# Patient Record
Sex: Female | Born: 1985 | Race: Black or African American | Hispanic: No | Marital: Single | Smoking: Never smoker
Health system: Southern US, Community
[De-identification: ages and names within clinical notes are randomized; demographics above are authoritative.]

## PROBLEM LIST (undated history)

## (undated) ENCOUNTER — Emergency Department (HOSPITAL_BASED_OUTPATIENT_CLINIC_OR_DEPARTMENT_OTHER): Admission: EM | Payer: Self-pay | Source: Home / Self Care

## (undated) ENCOUNTER — Inpatient Hospital Stay (HOSPITAL_COMMUNITY): Payer: Self-pay

## (undated) DIAGNOSIS — O26619 Liver and biliary tract disorders in pregnancy, unspecified trimester: Secondary | ICD-10-CM

## (undated) DIAGNOSIS — N854 Malposition of uterus: Secondary | ICD-10-CM

## (undated) DIAGNOSIS — K831 Obstruction of bile duct: Secondary | ICD-10-CM

## (undated) DIAGNOSIS — Z8619 Personal history of other infectious and parasitic diseases: Secondary | ICD-10-CM

## (undated) DIAGNOSIS — D649 Anemia, unspecified: Secondary | ICD-10-CM

## (undated) HISTORY — PX: DILATION AND CURETTAGE OF UTERUS: SHX78

## (undated) HISTORY — DX: Malposition of uterus: N85.4

## (undated) HISTORY — DX: Personal history of other infectious and parasitic diseases: Z86.19

---

## 2011-10-12 ENCOUNTER — Other Ambulatory Visit (HOSPITAL_COMMUNITY): Payer: Self-pay | Admitting: Physician Assistant

## 2011-10-12 DIAGNOSIS — Z3689 Encounter for other specified antenatal screening: Secondary | ICD-10-CM

## 2011-10-12 LAB — OB RESULTS CONSOLE RPR: RPR: NONREACTIVE

## 2011-10-12 LAB — OB RESULTS CONSOLE ANTIBODY SCREEN: Antibody Screen: NEGATIVE

## 2011-10-12 LAB — OB RESULTS CONSOLE HIV ANTIBODY (ROUTINE TESTING): HIV: NONREACTIVE

## 2011-10-12 LAB — OB RESULTS CONSOLE RUBELLA ANTIBODY, IGM: Rubella: IMMUNE

## 2011-10-12 LAB — OB RESULTS CONSOLE GC/CHLAMYDIA: Gonorrhea: NEGATIVE

## 2011-10-14 ENCOUNTER — Ambulatory Visit (HOSPITAL_COMMUNITY)
Admission: RE | Admit: 2011-10-14 | Discharge: 2011-10-14 | Disposition: A | Discharge status: Home / Self Care | Payer: Medicaid Other | Source: Ambulatory Visit | Attending: Physician Assistant | Admitting: Physician Assistant

## 2011-10-14 DIAGNOSIS — O358XX0 Maternal care for other (suspected) fetal abnormality and damage, not applicable or unspecified: Secondary | ICD-10-CM | POA: Insufficient documentation

## 2011-10-14 DIAGNOSIS — Z363 Encounter for antenatal screening for malformations: Secondary | ICD-10-CM | POA: Insufficient documentation

## 2011-10-14 DIAGNOSIS — Z1389 Encounter for screening for other disorder: Secondary | ICD-10-CM | POA: Insufficient documentation

## 2011-10-14 DIAGNOSIS — Z3689 Encounter for other specified antenatal screening: Secondary | ICD-10-CM

## 2011-12-12 ENCOUNTER — Inpatient Hospital Stay (HOSPITAL_COMMUNITY): Payer: Medicaid Other

## 2011-12-12 ENCOUNTER — Encounter (HOSPITAL_BASED_OUTPATIENT_CLINIC_OR_DEPARTMENT_OTHER): Payer: Self-pay

## 2011-12-12 ENCOUNTER — Inpatient Hospital Stay (HOSPITAL_BASED_OUTPATIENT_CLINIC_OR_DEPARTMENT_OTHER)
Admission: EM | Admit: 2011-12-12 | Discharge: 2011-12-12 | Disposition: A | Discharge status: Other Acute Inpatient Hospital | Payer: Medicaid Other | Attending: Emergency Medicine | Admitting: Emergency Medicine

## 2011-12-12 DIAGNOSIS — O209 Hemorrhage in early pregnancy, unspecified: Secondary | ICD-10-CM | POA: Insufficient documentation

## 2011-12-12 DIAGNOSIS — Z862 Personal history of diseases of the blood and blood-forming organs and certain disorders involving the immune mechanism: Secondary | ICD-10-CM | POA: Insufficient documentation

## 2011-12-12 DIAGNOSIS — O4690 Antepartum hemorrhage, unspecified, unspecified trimester: Secondary | ICD-10-CM

## 2011-12-12 HISTORY — DX: Anemia, unspecified: D64.9

## 2011-12-12 LAB — URINALYSIS, ROUTINE W REFLEX MICROSCOPIC
Glucose, UA: NEGATIVE mg/dL
Ketones, ur: NEGATIVE mg/dL
Leukocytes, UA: NEGATIVE
Protein, ur: NEGATIVE mg/dL
Urobilinogen, UA: 0.2 mg/dL (ref 0.0–1.0)

## 2011-12-12 LAB — CBC WITH DIFFERENTIAL/PLATELET
Basophils Absolute: 0 10*3/uL (ref 0.0–0.1)
Basophils Relative: 0 % (ref 0–1)
Eosinophils Relative: 1 % (ref 0–5)
HCT: 30.5 % — ABNORMAL LOW (ref 36.0–46.0)
MCHC: 33.1 g/dL (ref 30.0–36.0)
Monocytes Absolute: 0.8 10*3/uL (ref 0.1–1.0)
Neutro Abs: 7.2 10*3/uL (ref 1.7–7.7)
Platelets: 196 10*3/uL (ref 150–400)
RDW: 13.3 % (ref 11.5–15.5)

## 2011-12-12 LAB — WET PREP, GENITAL
Trich, Wet Prep: NONE SEEN
Yeast Wet Prep HPF POC: NONE SEEN

## 2011-12-12 LAB — BASIC METABOLIC PANEL
Calcium: 9 mg/dL (ref 8.4–10.5)
Chloride: 102 mEq/L (ref 96–112)
Creatinine, Ser: 0.6 mg/dL (ref 0.50–1.10)
GFR calc Af Amer: >90 mL/min (ref >90)

## 2011-12-12 LAB — RAPID URINE DRUG SCREEN, HOSP PERFORMED
Amphetamines: NOT DETECTED
Opiates: NOT DETECTED
Tetrahydrocannabinol: POSITIVE — AB

## 2011-12-12 LAB — URINE MICROSCOPIC-ADD ON

## 2011-12-12 LAB — PROTIME-INR: Prothrombin Time: 12.6 seconds (ref 11.6–15.2)

## 2011-12-12 LAB — ABO/RH: ABO/RH(D): B POS

## 2011-12-12 MED ORDER — SODIUM CHLORIDE 0.9 % IV BOLUS (SEPSIS)
1000.0000 mL | Freq: Once | INTRAVENOUS | Status: AC
  Administered 2011-12-12: 1000 mL via INTRAVENOUS

## 2011-12-12 MED ORDER — METRONIDAZOLE 500 MG PO TABS: 500.0000 mg | ORAL_TABLET | Freq: Two times a day (BID) | ORAL | Status: DC

## 2011-12-12 NOTE — Progress Notes (Signed)
Spoke with RN @ [facility] stating that Ms. Alexis Robbins came in after having vaginal bleeding @ home following intercourse.  Pt states that bleeding was bright red when she wiped after going to the bathroom.  Pt reports feeling good fetal movement and no leaking of fluid.

## 2011-12-12 NOTE — MAU Provider Note (Addendum)
History     CSN: [id number]  Arrival date and time: 12/12/11 0010   First Provider Initiated Contact with Patient 12/12/11 0141   Alexis Robbins 26 y.o. [id number] : 29 5/7 by [place] today  Chief Complaint  Patient presents with  . pregnant with vaginal bleeding    HPI Patient presented to the MAU this evening for post-coital vaginal bleeding. She describes as bright red/ blood in the toilet. She wore a panty liner into the MAU, however it has no blood on it. She stated it is just pink tinged now. She has had prenatal care and has received a 20 week ultrasound that was WNL. Her first child was delivered by C-Section for breech position, but does state she "starting to become preE and that is why they induced." She had no complications with her second pregnancy or delivery, which was a successful VBAC.   Past Medical History  Diagnosis Date  . Pregnant   . Anemia     History reviewed. No pertinent past surgical history.  No family history on file.  History  Substance Use Topics  . Smoking status: Never Smoker   . Smokeless tobacco: Not on file  . Alcohol Use:     Allergies: No Known Allergies  No prescriptions prior to admission    Review of Systems  Constitutional: Negative for fever and chills.  Eyes: Negative for blurred vision and double vision.  Respiratory: Negative for shortness of breath.   Cardiovascular: Negative for chest pain.  Gastrointestinal: Negative for nausea, vomiting and diarrhea.  Musculoskeletal: Negative for back pain.  Neurological: Negative for dizziness, weakness and headaches.  All other systems reviewed and are negative.    Physical Exam   Blood pressure 110/72, pulse 100, temperature 98 F (36.7 C), resp. rate 20, last menstrual period 05/26/2011, SpO2 100.00%.  Physical Exam  Constitutional: She is oriented to person, place, and time. She appears well-developed and well-nourished. No distress.  HENT:  Head: Normocephalic and  atraumatic.  Eyes: Conjunctivae normal and EOM are normal. Pupils are equal, round, and reactive to light. Right eye exhibits no discharge. Left eye exhibits no discharge. No scleral icterus.  Neck: Neck supple.  Cardiovascular: Normal rate, regular rhythm, normal heart sounds and intact distal pulses.   No murmur heard. Respiratory: Effort normal and breath sounds normal. She has no wheezes. She has no rales.  GI: Soft. Bowel sounds are normal. She exhibits no distension and no mass. There is no tenderness. There is no rebound and no guarding.       Gravid   Genitourinary: Vagina normal.       Spec: Small amount of  dark brown blood notable. OS was visualized and closed with small amount of red mucous-like discharge draining form OS. No polyps or lacerations noted.   Musculoskeletal: Normal range of motion. She exhibits no edema and no tenderness.  Neurological: She is alert and oriented to person, place, and time.  Skin: Skin is warm and dry.  Psychiatric: She has a normal mood and affect.    EFM: reassuring with accelerations. Moderate variability. No contractions. MAU Course  Procedures 1. EFM 2. UA/UDS 3. Speculum exam 4. Ultrasound limited for placenta &  cervical length  5. Wet prep: BV  Assessment and Plan  1. Post-coital bleeding in pregnancy 2. Ultrasound: WNL. No signs of previa or placental abnormality; no cervical shortening  - EDD: 03/01/2012     GA: 29  5/7 3. BV: Flagyl 4. Discharge home with labor  precautions - Discussed patient and plan with Roney Marion, CNM  Kuneff, Renee 12/12/2011, 1:42 AM   I examined pt and agree with documentation above and resident plan of care. [facility]

## 2011-12-12 NOTE — ED Notes (Signed)
Patient reports that she developed some light vaginal bleeding this pm. Reports that she has no cramping or backpain with same. LMP 4/25, estimated [redacted] weeks gestation

## 2011-12-12 NOTE — ED Notes (Signed)
The bleeding started following sexual intercourse this evening

## 2011-12-12 NOTE — Progress Notes (Signed)
Spoke w/ Dr. Terressa Koyanagi about tracing.  She stated that she had spoken to Dr. Erin Fulling and POC was for pt to be transferred to [facility].

## 2011-12-12 NOTE — MAU Note (Signed)
Arrived via EMS from [facility].  No pain or  bleeding upon arrival.   States the bleeding began after intercourse.

## 2011-12-12 NOTE — ED Notes (Signed)
Patient remains on fetal monitor continuously.  Dr. Nicanor Alcon back at bedside for bi-manual exam. Small amount of pinkish tinged colored discharge on pantie liner. Patient reports that the baby remains active

## 2011-12-12 NOTE — ED Notes (Signed)
Spoke with RN Morrie Sheldon from [facility] [employer]- states they have monitored pt- no contractions noted- baby WNL- EDP [place] updated and speaking to [employer] at this time by phone

## 2011-12-12 NOTE — MAU Provider Note (Signed)
I reviewed pt and agree with documentation above and resident plan of care. [facility]

## 2011-12-12 NOTE — ED Provider Notes (Signed)
History    This chart was scribed for Alexis Parco Smitty Cords, MD, MD by Smitty Pluck, ED Scribe. The patient was seen in room MHT14 and the patient's care was started at 12:14PM.  CSN: [id number]  Arrival date & time 12/12/11  0010   None     No chief complaint on file.    Patient is a 26 y.o. female presenting with vaginal bleeding. The history is provided by the patient. No language interpreter was used.  Vaginal Bleeding This is a new problem. The current episode started less than 1 hour ago. The problem occurs constantly. The problem has been gradually improving. Pertinent negatives include no abdominal pain. The symptoms are aggravated by intercourse. Nothing relieves the symptoms. She has tried nothing for the symptoms. The treatment provided moderate relief.   Alexis Robbins is a 26 y.o. female who presents to the Emergency Department complaining of constant, moderate vaginal bleeding onset today 30 minutes ago. She states that bleeding started after sexual intercourse. She noticed blood in bed and in toilet stool. Denies clotting. Reports blood was dark read. Pt denies leakage of any other fluid. She is [redacted] weeks pregnant. Her due date is 03-01-12. Denies dysuria. Pt is gravida 3, para 2 and abortus 0. Pt denies any pain. Pt has taken iron supplements due to anemia. Denies any trauma, bleeding throughout pregnancy, fevers, chills, nausea, vomiting and any other pain. Pt was told that her baby was breech during last ultrasound (14 weeks). LMP was 05/26/11.   [facility]   No past medical history on file.  No past surgical history on file.  No family history on file.  History  Substance Use Topics  . Smoking status: Not on file  . Smokeless tobacco: Not on file  . Alcohol Use: Not on file    OB History    No data available      Review of Systems  Constitutional: Negative for fever.  Respiratory: Negative for cough.   Gastrointestinal:  Negative for vomiting and abdominal pain.  Genitourinary: Positive for vaginal bleeding.  All other systems reviewed and are negative.    Allergies  Review of patient's allergies indicates not on file.  Home Medications  No current outpatient prescriptions on file.  BP 128/89  Pulse 103  Temp 98 F (36.7 C)  Resp 20  SpO2 100%  LMP 05/26/2011  Physical Exam  Nursing note and vitals reviewed. Constitutional: She is oriented to person, place, and time. She appears well-developed and well-nourished.  HENT:  Head: Normocephalic and atraumatic.  Eyes:       Pale conjunctiva   Neck: Normal range of motion. Neck supple.  Cardiovascular: Normal rate, regular rhythm and normal heart sounds.   Pulmonary/Chest: Effort normal and breath sounds normal. No respiratory distress. She has no wheezes. She has no rales.  Genitourinary:       Uterus above umbilicus  No signs of lacerations Scant blood introitus  nitrazine paper positive   Neurological: She is alert and oriented to person, place, and time.  Skin: Skin is warm and dry.  Psychiatric: She has a normal mood and affect. Her behavior is normal.    ED Course  Procedures (including critical care time) DIAGNOSTIC STUDIES: Oxygen Saturation is 100% on room air, normal by my interpretation.    COORDINATION OF CARE: 12:29 AM Discussed ED treatment with pt     Labs Reviewed - No data to display No results found.  No diagnosis found.    MDM  Case d/w Dr. Erin Fulling transfer to MAU   Patient placed on wedge.  Bolus of IVF initiated Sterile gloves used. Chaperone present. Internal exam deferred. No presenting parts.    I personally performed the services described in this documentation, which was scribed in my presence. The recorded information has been reviewed and is accurate.    Jasmine Awe, MD 12/12/11 [phone]

## 2011-12-15 NOTE — MAU Provider Note (Signed)
Attestation of Attending Supervision of Advanced Practitioner (CNM/NP): Evaluation and management procedures were performed by the Advanced Practitioner under my supervision and collaboration.  I have reviewed the Advanced Practitioner's note and chart, and I agree with the management and plan.  Clarinda Obi 12/15/2011 2:26 PM

## 2012-02-01 NOTE — L&D Delivery Note (Signed)
Delivery Note At 9:26 PM a viable female was delivered via successful spontaneous vaginal birth after cesarean (Presentation: ROA ), somersaulted through loose nuchal cord x1.  APGAR:9/9; weight: pending   Placenta status:delivered spontaneously intact w/ 3vc Cord: 3vc with the following complications:none  Cord pH: not done  Anesthesia: Epidural  Episiotomy: n/a Lacerations: intact Suture Repair: n/a Est. Blood Loss (mL): 250  Mom to postpartum.  Baby to nursery-stable.  Breastfeeding, for interim BTL d/t papers not signed.  Desires outpatient circumcision.   Marge Duncans 02/11/2012, 9:54 PM

## 2012-02-08 LAB — OB RESULTS CONSOLE GBS: GBS: NEGATIVE

## 2012-02-10 ENCOUNTER — Encounter: Payer: Self-pay | Admitting: Obstetrics & Gynecology

## 2012-02-10 ENCOUNTER — Inpatient Hospital Stay (HOSPITAL_COMMUNITY)
Admission: AD | Admit: 2012-02-10 | Discharge: 2012-02-13 | DRG: 775 | Disposition: A | Discharge status: Home / Self Care | Payer: Medicaid Other | Source: Ambulatory Visit | Attending: Obstetrics & Gynecology | Admitting: Obstetrics & Gynecology

## 2012-02-10 DIAGNOSIS — K831 Obstruction of bile duct: Secondary | ICD-10-CM

## 2012-02-10 DIAGNOSIS — O26619 Liver and biliary tract disorders in pregnancy, unspecified trimester: Principal | ICD-10-CM | POA: Diagnosis present

## 2012-02-10 DIAGNOSIS — O26649 Intrahepatic cholestasis of pregnancy, unspecified trimester: Secondary | ICD-10-CM | POA: Insufficient documentation

## 2012-02-10 DIAGNOSIS — K838 Other specified diseases of biliary tract: Secondary | ICD-10-CM | POA: Diagnosis present

## 2012-02-10 DIAGNOSIS — O34219 Maternal care for unspecified type scar from previous cesarean delivery: Secondary | ICD-10-CM | POA: Diagnosis present

## 2012-02-10 HISTORY — DX: Obstruction of bile duct: K83.1

## 2012-02-10 HISTORY — DX: Liver and biliary tract disorders in pregnancy, unspecified trimester: O26.619

## 2012-02-10 LAB — TYPE AND SCREEN: ABO/RH(D): B POS

## 2012-02-10 LAB — CBC
Hemoglobin: 10.4 g/dL — ABNORMAL LOW (ref 12.0–15.0)
MCHC: 32.7 g/dL (ref 30.0–36.0)
RDW: 15.4 % (ref 11.5–15.5)
WBC: 12.7 10*3/uL — ABNORMAL HIGH (ref 4.0–10.5)

## 2012-02-10 MED ORDER — OXYCODONE-ACETAMINOPHEN 5-325 MG PO TABS: 1.0000 | ORAL_TABLET | ORAL | Status: DC | PRN

## 2012-02-10 MED ORDER — ONDANSETRON HCL 4 MG/2ML IJ SOLN: 4.0000 mg | Freq: Four times a day (QID) | INTRAMUSCULAR | Status: DC | PRN

## 2012-02-10 MED ORDER — FENTANYL CITRATE 0.05 MG/ML IJ SOLN
100.0000 ug | INTRAMUSCULAR | Status: DC | PRN
  Administered 2012-02-11 (×3): 100 ug via INTRAVENOUS
  Filled 2012-02-10 (×4): qty 2

## 2012-02-10 MED ORDER — CITRIC ACID-SODIUM CITRATE 334-500 MG/5ML PO SOLN: 30.0000 mL | ORAL | Status: DC | PRN

## 2012-02-10 MED ORDER — LACTATED RINGERS IV SOLN
500.0000 mL | INTRAVENOUS | Status: DC | PRN
  Administered 2012-02-11: 500 mL via INTRAVENOUS

## 2012-02-10 MED ORDER — OXYTOCIN BOLUS FROM INFUSION: 500.0000 mL | INTRAVENOUS | Status: DC

## 2012-02-10 MED ORDER — ACETAMINOPHEN 325 MG PO TABS: 650.0000 mg | ORAL_TABLET | ORAL | Status: DC | PRN

## 2012-02-10 MED ORDER — OXYTOCIN 40 UNITS IN LACTATED RINGERS INFUSION - SIMPLE MED
62.5000 mL/h | INTRAVENOUS | Status: DC
  Administered 2012-02-11: 62.5 mL/h via INTRAVENOUS

## 2012-02-10 MED ORDER — ZOLPIDEM TARTRATE 5 MG PO TABS
5.0000 mg | ORAL_TABLET | Freq: Every evening | ORAL | Status: DC | PRN
  Administered 2012-02-10: 5 mg via ORAL
  Filled 2012-02-10: qty 1

## 2012-02-10 MED ORDER — IBUPROFEN 600 MG PO TABS: 600.0000 mg | ORAL_TABLET | Freq: Four times a day (QID) | ORAL | Status: DC | PRN

## 2012-02-10 MED ORDER — LACTATED RINGERS IV SOLN
INTRAVENOUS | Status: DC
  Administered 2012-02-10 – 2012-02-11 (×2): via INTRAVENOUS

## 2012-02-10 MED ORDER — LIDOCAINE HCL (PF) 1 % IJ SOLN: 30.0000 mL | INTRAMUSCULAR | Status: DC | PRN

## 2012-02-10 NOTE — H&P (Signed)
Alexis Robbins is a 27 y.o. G3P2002 at [redacted]w[redacted]d presenting for IOL secondary to cholestasis of pregnancy.  She first started itching a few weeks ago, but it grew unbearable last weekend, and she reported it at her scheduled prenatal visit, so labs were drawn, which returned today and she was advised that IOL would need to begin. At the time of my exam, her itching is minimal, she is feeling fetal movement, has not had any LOF or blood.  First pregnancy was LTCS due to breech presentation with subsequent VBAC in 2011, and desires TOLAC with this pregnancy; consent is signed.  Maternal Medical History:  Reason for admission: Reason for Admission:   nauseaIOL for cholestasis of pregnancy  Contractions: Frequency: irregular.    Fetal activity: Perceived fetal activity is normal.   Last perceived fetal movement was within the past hour.    Prenatal complications: No bleeding, hypertension or pre-eclampsia.   Prenatal Complications - Diabetes: none.   1 hr GTT 81  OB History    Grav Para Term Preterm Abortions TAB SAB Ect Mult Living   3 2 2       2      Past Medical History  Diagnosis Date  . Pregnant   . Anemia   . Intrahepatic cholestasis of pregnancy, antepartum 02/10/2012    Induction of labor   Past Surgical History  Procedure Date  . Cesarean section    Family History: family history is not on file. Social History:  reports that she has never smoked. She does not have any smokeless tobacco history on file. Her alcohol and drug histories not on file.   Prenatal Transfer Tool  Maternal Diabetes: No Genetic Screening: Normal Maternal Ultrasounds/Referrals: Normal Fetal Ultrasounds or other Referrals:  None Maternal Substance Abuse:  No Significant Maternal Medications:  None Significant Maternal Lab Results:  Lab values include: Group B Strep negative Other Comments:  None  Review of Systems  Constitutional: Negative for fever and chills.  Eyes: Negative for blurred  vision.  Respiratory: Negative for cough and shortness of breath.   Cardiovascular: Negative for chest pain and palpitations.  Gastrointestinal: Negative for heartburn, nausea, vomiting and constipation.  Genitourinary: Negative for dysuria.  Musculoskeletal: Negative for myalgias.  Skin: Positive for itching. Negative for rash.  Neurological: Negative for dizziness and headaches.    Dilation: 1 Effacement (%): 60 Station: -2 Exam by:: K. Clelia Croft, cnm Blood pressure 116/75, pulse 102, temperature 98.2 F (36.8 C), temperature source Oral, resp. rate 18, height 5\' 6"  (1.676 m), weight 87.998 kg (194 lb). Maternal Exam:  Uterine Assessment: Contraction frequency is irregular.   Abdomen: Patient reports no abdominal tenderness. Surgical scars: low transverse.   Fetal presentation: vertex  Introitus: Normal vulva. Normal vagina.  Cervix: Cervix evaluated by digital exam.   1/  Fetal Exam Fetal Monitor Review: Baseline rate: 120.  Variability: moderate (6-25 bpm).   Pattern: accelerations present and no decelerations.    Fetal State Assessment: Category I - tracings are normal.     Physical Exam  Constitutional: She is oriented to person, place, and time. She appears well-developed and well-nourished. No distress.  HENT:  Head: Normocephalic and atraumatic.  Mouth/Throat: Oropharynx is clear and moist.  Eyes: Conjunctivae normal and EOM are normal. Pupils are equal, round, and reactive to light.  Neck: Normal range of motion.  Cardiovascular: Normal rate, regular rhythm and intact distal pulses.   No murmur heard. Respiratory: Effort normal and breath sounds normal. No respiratory distress.  GI:  Soft.       gravid  Genitourinary: Vagina normal and uterus normal.  Musculoskeletal: Normal range of motion. She exhibits edema. She exhibits no tenderness.  Neurological: She is alert and oriented to person, place, and time. No cranial nerve deficit.  Skin: Skin is warm and dry. No  rash noted.  Psychiatric: She has a normal mood and affect.    Prenatal labs: ABO, Rh: --/--/B POS (01/10 1950) Antibody: NEG (01/10 1950) Rubella: Immune (09/11 0000) RPR: Nonreactive, Nonreactive (09/11 0000)  HBsAg: Negative (09/11 0000)  HIV: Non-reactive (09/11 0000)  GBS: Negative (01/08 0000)   Assessment/Plan: Alexis Robbins is a 27 y.o. G3P2002 at 103w1d presenting for IOL secondary to cholestasis of pregnancy.  - admit to Labor and delivery for IOL - foley bulb placed- bloody show noted - Patient desires TOLAC, consent signed, and she has had VBAC.  Bonnita Nasuti 02/10/2012, 10:40 PM  I have seen and examined this patient and I agree with the above. SHAW, KIMBERLY 1:13 AM 02/11/2012

## 2012-02-11 ENCOUNTER — Encounter (HOSPITAL_COMMUNITY): Payer: Self-pay | Admitting: *Deleted

## 2012-02-11 ENCOUNTER — Encounter (HOSPITAL_COMMUNITY): Payer: Self-pay | Admitting: Anesthesiology

## 2012-02-11 ENCOUNTER — Inpatient Hospital Stay (HOSPITAL_COMMUNITY): Payer: Medicaid Other | Admitting: Anesthesiology

## 2012-02-11 DIAGNOSIS — O26619 Liver and biliary tract disorders in pregnancy, unspecified trimester: Secondary | ICD-10-CM

## 2012-02-11 DIAGNOSIS — O34219 Maternal care for unspecified type scar from previous cesarean delivery: Secondary | ICD-10-CM

## 2012-02-11 DIAGNOSIS — K838 Other specified diseases of biliary tract: Secondary | ICD-10-CM

## 2012-02-11 LAB — RPR: RPR Ser Ql: NONREACTIVE

## 2012-02-11 MED ORDER — EPHEDRINE 5 MG/ML INJ
10.0000 mg | INTRAVENOUS | Status: DC | PRN
  Filled 2012-02-11: qty 4

## 2012-02-11 MED ORDER — DIPHENHYDRAMINE HCL 25 MG PO CAPS
25.0000 mg | ORAL_CAPSULE | Freq: Four times a day (QID) | ORAL | Status: DC | PRN
  Administered 2012-02-11: 25 mg via ORAL
  Filled 2012-02-11: qty 1

## 2012-02-11 MED ORDER — DIPHENHYDRAMINE HCL 50 MG/ML IJ SOLN: 12.5000 mg | INTRAMUSCULAR | Status: DC | PRN

## 2012-02-11 MED ORDER — DIBUCAINE 1 % RE OINT: 1.0000 "application " | TOPICAL_OINTMENT | RECTAL | Status: DC | PRN

## 2012-02-11 MED ORDER — OXYTOCIN 40 UNITS IN LACTATED RINGERS INFUSION - SIMPLE MED: 62.5000 mL/h | INTRAVENOUS | Status: DC | PRN

## 2012-02-11 MED ORDER — SODIUM CHLORIDE 0.9 % IJ SOLN: 3.0000 mL | Freq: Two times a day (BID) | INTRAMUSCULAR | Status: DC

## 2012-02-11 MED ORDER — FERROUS SULFATE 325 (65 FE) MG PO TABS
325.0000 mg | ORAL_TABLET | Freq: Two times a day (BID) | ORAL | Status: DC
  Administered 2012-02-12 – 2012-02-13 (×3): 325 mg via ORAL
  Filled 2012-02-11 (×3): qty 1

## 2012-02-11 MED ORDER — OXYTOCIN 40 UNITS IN LACTATED RINGERS INFUSION - SIMPLE MED
1.0000 m[IU]/min | INTRAVENOUS | Status: DC
  Administered 2012-02-11: 1 m[IU]/min via INTRAVENOUS
  Filled 2012-02-11: qty 1000

## 2012-02-11 MED ORDER — PHENYLEPHRINE 40 MCG/ML (10ML) SYRINGE FOR IV PUSH (FOR BLOOD PRESSURE SUPPORT)
80.0000 ug | PREFILLED_SYRINGE | INTRAVENOUS | Status: DC | PRN
  Filled 2012-02-11: qty 5

## 2012-02-11 MED ORDER — TETANUS-DIPHTH-ACELL PERTUSSIS 5-2.5-18.5 LF-MCG/0.5 IM SUSP
0.5000 mL | Freq: Once | INTRAMUSCULAR | Status: AC
  Administered 2012-02-13: 0.5 mL via INTRAMUSCULAR

## 2012-02-11 MED ORDER — FLEET ENEMA 7-19 GM/118ML RE ENEM: 1.0000 | ENEMA | Freq: Every day | RECTAL | Status: DC | PRN

## 2012-02-11 MED ORDER — ZOLPIDEM TARTRATE 5 MG PO TABS: 5.0000 mg | ORAL_TABLET | Freq: Every evening | ORAL | Status: DC | PRN

## 2012-02-11 MED ORDER — SIMETHICONE 80 MG PO CHEW: 80.0000 mg | CHEWABLE_TABLET | ORAL | Status: DC | PRN

## 2012-02-11 MED ORDER — ONDANSETRON HCL 4 MG PO TABS: 4.0000 mg | ORAL_TABLET | ORAL | Status: DC | PRN

## 2012-02-11 MED ORDER — SENNOSIDES-DOCUSATE SODIUM 8.6-50 MG PO TABS
2.0000 | ORAL_TABLET | Freq: Every day | ORAL | Status: DC
  Administered 2012-02-12: 2 via ORAL

## 2012-02-11 MED ORDER — FENTANYL 2.5 MCG/ML BUPIVACAINE 1/10 % EPIDURAL INFUSION (WH - ANES)
14.0000 mL/h | INTRAMUSCULAR | Status: DC
  Filled 2012-02-11: qty 125

## 2012-02-11 MED ORDER — ONDANSETRON HCL 4 MG/2ML IJ SOLN: 4.0000 mg | INTRAMUSCULAR | Status: DC | PRN

## 2012-02-11 MED ORDER — BENZOCAINE-MENTHOL 20-0.5 % EX AERO: 1.0000 "application " | INHALATION_SPRAY | CUTANEOUS | Status: DC | PRN

## 2012-02-11 MED ORDER — WITCH HAZEL-GLYCERIN EX PADS: 1.0000 "application " | MEDICATED_PAD | CUTANEOUS | Status: DC | PRN

## 2012-02-11 MED ORDER — SODIUM CHLORIDE 0.9 % IJ SOLN: 3.0000 mL | INTRAMUSCULAR | Status: DC | PRN

## 2012-02-11 MED ORDER — MEASLES, MUMPS & RUBELLA VAC ~~LOC~~ INJ
0.5000 mL | INJECTION | Freq: Once | SUBCUTANEOUS | Status: DC
  Filled 2012-02-11: qty 0.5

## 2012-02-11 MED ORDER — LANOLIN HYDROUS EX OINT: TOPICAL_OINTMENT | CUTANEOUS | Status: DC | PRN

## 2012-02-11 MED ORDER — LIDOCAINE HCL (PF) 1 % IJ SOLN
INTRAMUSCULAR | Status: DC | PRN
  Administered 2012-02-11 (×2): 4 mL

## 2012-02-11 MED ORDER — SODIUM CHLORIDE 0.9 % IV SOLN: 250.0000 mL | INTRAVENOUS | Status: DC | PRN

## 2012-02-11 MED ORDER — PRENATAL MULTIVITAMIN CH
1.0000 | ORAL_TABLET | Freq: Every day | ORAL | Status: DC
  Administered 2012-02-12 – 2012-02-13 (×2): 1 via ORAL
  Filled 2012-02-11 (×2): qty 1

## 2012-02-11 MED ORDER — IBUPROFEN 600 MG PO TABS
600.0000 mg | ORAL_TABLET | Freq: Four times a day (QID) | ORAL | Status: DC
  Administered 2012-02-12 – 2012-02-13 (×6): 600 mg via ORAL
  Filled 2012-02-11 (×6): qty 1

## 2012-02-11 MED ORDER — LACTATED RINGERS IV SOLN: 500.0000 mL | Freq: Once | INTRAVENOUS | Status: DC

## 2012-02-11 MED ORDER — PHENYLEPHRINE 40 MCG/ML (10ML) SYRINGE FOR IV PUSH (FOR BLOOD PRESSURE SUPPORT): 80.0000 ug | PREFILLED_SYRINGE | INTRAVENOUS | Status: DC | PRN

## 2012-02-11 MED ORDER — OXYCODONE-ACETAMINOPHEN 5-325 MG PO TABS: 1.0000 | ORAL_TABLET | ORAL | Status: DC | PRN

## 2012-02-11 MED ORDER — TERBUTALINE SULFATE 1 MG/ML IJ SOLN: 0.2500 mg | Freq: Once | INTRAMUSCULAR | Status: DC | PRN

## 2012-02-11 MED ORDER — BISACODYL 10 MG RE SUPP: 10.0000 mg | Freq: Every day | RECTAL | Status: DC | PRN

## 2012-02-11 MED ORDER — EPHEDRINE 5 MG/ML INJ: 10.0000 mg | INTRAVENOUS | Status: DC | PRN

## 2012-02-11 MED ORDER — FENTANYL 2.5 MCG/ML BUPIVACAINE 1/10 % EPIDURAL INFUSION (WH - ANES)
INTRAMUSCULAR | Status: DC | PRN
  Administered 2012-02-11: 14 mL/h via EPIDURAL

## 2012-02-11 NOTE — Anesthesia Preprocedure Evaluation (Signed)
Anesthesia Evaluation  Patient identified by MRN, date of birth, ID band Patient awake    Reviewed: Allergy & Precautions, H&P , Patient's Chart, lab work & pertinent test results  Airway Mallampati: III TM Distance: >3 FB Neck ROM: full    Dental No notable dental hx. (+) Teeth Intact   Pulmonary neg pulmonary ROS,  breath sounds clear to auscultation  Pulmonary exam normal       Cardiovascular negative cardio ROS  Rhythm:regular Rate:Normal     Neuro/Psych negative neurological ROS  negative psych ROS   GI/Hepatic negative GI ROS, Neg liver ROS, Cholestasis of pregnancy   Endo/Other  negative endocrine ROS  Renal/GU negative Renal ROS  negative genitourinary   Musculoskeletal   Abdominal Normal abdominal exam  (+)   Peds  Hematology negative hematology ROS (+) anemia ,   Anesthesia Other Findings   Reproductive/Obstetrics (+) Pregnancy                           Anesthesia Physical Anesthesia Plan  ASA: II  Anesthesia Plan: Epidural   Post-op Pain Management:    Induction:   Airway Management Planned:   Additional Equipment:   Intra-op Plan:   Post-operative Plan:   Informed Consent: I have reviewed the patients History and Physical, chart, labs and discussed the procedure including the risks, benefits and alternatives for the proposed anesthesia with the patient or authorized representative who has indicated his/her understanding and acceptance.     Plan Discussed with: Anesthesiologist  Anesthesia Plan Comments:         Anesthesia Quick Evaluation

## 2012-02-11 NOTE — Progress Notes (Signed)
Alexis Robbins is a 27 y.o. G3P2002 at [redacted]w[redacted]d admitted for induction of labor due to cholestasis.  Subjective: Still feeling pain w/ uc's, not any worse than last time I checked on her. Requests arom.  Objective: BP 119/69  Pulse 93  Temp 98 F (36.7 C) (Oral)  Resp 18  Ht 5\' 6"  (1.676 m)  Wt 87.998 kg (194 lb)  BMI 31.31 kg/m2      FHT:  FHR: 135 bpm, variability: moderate,  accelerations:  Present,  decelerations:  Absent UC:   irregular, every 3-4 minutes SVE:   Dilation: 5 Effacement (%): 80 Station: -2;-1 Exam by:: kim Caron Tardif cnm arom large amount of clear fluid w/ blood-tinged mucous  Labs: Lab Results  Component Value Date   WBC 12.7* 02/10/2012   HGB 10.4* 02/10/2012   HCT 31.8* 02/10/2012   MCV 89.8 02/10/2012   PLT 171 02/10/2012    Assessment / Plan: IOL d/t cholestasis, progressing slowly on pitocin, currently at 94mu/min.  Now AROM'd.  RN to continue increasing pitocin per protocol to acheive adequate uc pattern/dilation.   Labor: Progressing normally Preeclampsia:  n/a Fetal Wellbeing:  Category I Pain Control:  Labor support without medications I/D:  n/a Anticipated MOD:  VBAC  Marge Duncans 02/11/2012, 5:44 PM

## 2012-02-11 NOTE — Anesthesia Procedure Notes (Signed)
Epidural Patient location during procedure: OB Start time: 02/11/2012 7:36 PM  Staffing Anesthesiologist: Genese Quebedeaux A. Performed by: anesthesiologist   Preanesthetic Checklist Completed: patient identified, site marked, surgical consent, pre-op evaluation, timeout performed, IV checked, risks and benefits discussed and monitors and equipment checked  Epidural Patient position: sitting Prep: site prepped and draped and DuraPrep Patient monitoring: continuous pulse ox and blood pressure Approach: midline Injection technique: LOR air  Needle:  Needle type: Tuohy  Needle gauge: 17 G Needle length: 9 cm and 9 Needle insertion depth: 5 cm cm Catheter type: closed end flexible Catheter size: 19 Gauge Catheter at skin depth: 10 cm Test dose: negative and Other  Assessment Events: blood not aspirated, injection not painful, no injection resistance, negative IV test and no paresthesia  Additional Notes Patient identified. Risks and benefits discussed including failed block, incomplete  Pain control, post dural puncture headache, nerve damage, paralysis, blood pressure Changes, nausea, vomiting, reactions to medications-both toxic and allergic and post Partum back pain. All questions were answered. Patient expressed understanding and wished to proceed. Sterile technique was used throughout procedure. Epidural site was Dressed with sterile barrier dressing. No paresthesias, signs of intravascular injection Or signs of intrathecal spread were encountered.  Patient was more comfortable after the epidural was dosed. Please see RN's note for documentation of vital signs and FHR which are stable.

## 2012-02-11 NOTE — Progress Notes (Signed)
Alexis Robbins is a 27 y.o. G3P2002 at [redacted]w[redacted]d admitted for induction of labor due to cholestasis.  Subjective: Beginning to get uncomfortable w/ uc's  Objective: BP 124/74  Pulse 87  Temp 98 F (36.7 C) (Oral)  Resp 18  Ht 5\' 6"  (1.676 m)  Wt 87.998 kg (194 lb)  BMI 31.31 kg/m2      FHT:  FHR: 135 bpm, variability: moderate,  accelerations:  Present,  decelerations:  Absent UC:   irregular, every 2-6 minutes SVE:   Dilation: 4.5 Effacement (%): 80 Station: -1 Exam by:: Kim Marcee Jacobs,CNM w/ small bbow  Labs: Lab Results  Component Value Date   WBC 12.7* 02/10/2012   HGB 10.4* 02/10/2012   HCT 31.8* 02/10/2012   MCV 89.8 02/10/2012   PLT 171 02/10/2012    Assessment / Plan: IOL d/t cholestasis, progressing on pitocin- currently at 36mu/min. RN to continue increasing pitocin per protocol to achieve adequate uc pattern/dilation Discussed pain relief options- wants to get oob and try birthing ball/moving around- will call for pain meds when ready  Labor: latent phase, will wait until active phase for arom Preeclampsia:  n/a Fetal Wellbeing:  Category I Pain Control:  Labor support without medications I/D:  n/a Anticipated MOD:  NSVD  Marge Duncans 02/11/2012, 2:36 PM

## 2012-02-11 NOTE — Progress Notes (Signed)
Corsica Franson is a 27 y.o. G3P2002 at [redacted]w[redacted]d  Subjective: Feeling ctx mildly  Objective: BP 130/89  Pulse 93  Temp 98.1 F (36.7 C) (Oral)  Resp 18  Ht 5\' 6"  (1.676 m)  Wt 194 lb (87.998 kg)  BMI 31.31 kg/m2      FHT:  FHR: 125 bpm, variability: moderate,  accelerations:  Present,  decelerations:  Absent UC:   irregular, every 3-7 minutes SVE:   Dilation: 4 Effacement (%): 60 Station: -2 Exam by:: ansah-mensah, rnc- exam deferred  Labs: Lab Results  Component Value Date   WBC 12.7* 02/10/2012   HGB 10.4* 02/10/2012   HCT 31.8* 02/10/2012   MCV 89.8 02/10/2012   PLT 171 02/10/2012    Assessment / Plan: IOL process Cholestasis VBAC  -  Light breakfast -  Pitocin per low dose  Marda Breidenbach 02/11/2012, 7:26 AM

## 2012-02-12 MED ORDER — INFLUENZA VIRUS VACC SPLIT PF IM SUSP: 0.5000 mL | INTRAMUSCULAR | Status: DC

## 2012-02-12 NOTE — Clinical Social Work Maternal (Signed)
Clinical Social Work Department PSYCHOSOCIAL ASSESSMENT - MATERNAL/CHILD 02/12/2012  Patient:  Alexis Robbins, Alexis Robbins  Account Number:  [id number]  Admit Date:  02/10/2012  Marjo Bicker Name:   Asher Muir    Clinical Social Worker:  Lulu Riding, LCSW   Date/Time:  02/12/2012 11:00 AM  Date Referred:  02/12/2012   Referral source  CN     Referred reason  Substance Abuse   Other referral source:    I:  FAMILY / HOME ENVIRONMENT Child's legal guardian:  PARENT  Guardian - Name Guardian - Age Guardian - Address  Karey Suthers [address] [place], [place], [place] [address]  Corinna Capra  same   Other household support members/support persons Name Relationship DOB   DAUGHTER 5   DAUGHTER 22 months   Other support:   MGM is visiting from [place] for as long as needed.    II  PSYCHOSOCIAL DATA Information Source:  Patient Interview  Event organiser Employment:   FOB works at [employer], but [employer] thinks it may just be seasonal work.  She reports moving here from [place] in June due to high cost of living and both she and FOB being laid off.   Financial resources:  Medicaid If Medicaid - County:  [place] Other  [facility]   School / Grade:   Maternity Care Coordinator / Child Services Coordination / Early Interventions:  Cultural issues impacting care:   None identified    III  STRENGTHS Strengths  Supportive family/friends  Compliance with medical plan  Home prepared for Child (including basic supplies)   Strength comment:    IV  RISK FACTORS AND CURRENT PROBLEMS Current Problem:  YES   Risk Factor & Current Problem Patient Issue Family Issue Risk Factor / Current Problem Comment  Financial Resources N [place] MOB is concerned about finances having been recently laid off from her job.  She states FOB does not work enough to pay the bills and was also recently laid off.   N N     V  SOCIAL WORK ASSESSMENT CSW met with MOB in her first floor room/102 to complete  assessment for hx of positive THC screen in November 2013. MOB was very pleasant, but states she is tired because she "can't sleep."  We talked about possible reasons, and she cannot identify why and states this has never happened before.  (CSW reported this to RN).  CSW talked in depth about PPD signs and symptoms and how inabiity to sleep can be a symptom.  CSW advised MOB to keep a close check on symptoms and to contact her doctor if they persist or get worse.  She agreed.  She seems overwhelmed with her family's current financial situation and recent layoffs, which is understandable.  She states she has been looking for a job, but couldn't really apply places "with a huge belly."  CSW asked her to identify any positives in her life and she said that things at home are good and that her relationship with FOB is good.  She is also thankful for healthy children.  CSW recommends outpatient counseling and MOB is willing and interested.  CSW will make referral to [facility] since they are very flexible with meeting times and places and can most likely accommodate MOB's busy life with three children.  MOB seemed appreciative.  CSW asked MOB about the positive marijuana screen and she states she smoked 1 time because of stress.  We discussed more positive  coping mechanisms and CSW explained hospital drug screen policy.  MOB began to cry.  CSW stayed and talked her through this and explained that what matters most is not the decisions she has made in the past, but the decisions she will make from now on and that if baby is positive she will need to be open and honest with CPS as she has been with CSW.  CSW informed her of negative urine screen on baby.  She calmed some, but appears very overwhelmed.  She states her mother has come down from [place] to stay as long as MOB needs her to help with the new baby and big sisters.  MOB is thankful for this.  She states they have mostly everything for the baby,  although she is limited in 0-3 month clothes since he was smaller than she thought he would be.  CSW provided her with a few outfits and she was appreciative.      VI SOCIAL WORK PLAN Social Work Plan  No Further Intervention Required / No Barriers to Discharge   Type of pt/family education:   PPD  Benefits of outpatient counseling   If child protective services report - county:   If child protective services report - date:   Information/referral to community resources comment:   [employer]   Other social work plan:   CSW will monitor meconium drug screen results.

## 2012-02-12 NOTE — Progress Notes (Signed)
Post Partum Day 1 Subjective: no complaints, up ad lib, voiding, tolerating PO and + flatus  Objective: Blood pressure 127/80, pulse 91, temperature 98.5 F (36.9 C), temperature source Oral, resp. rate 18, height 5\' 6"  (1.676 m), weight 87.998 kg (194 lb), SpO2 98.00%, unknown if currently breastfeeding.  Physical Exam:  General: alert, cooperative and no distress Lochia: appropriate Uterine Fundus: firm Incision: n/a DVT Evaluation: No evidence of DVT seen on physical exam. Negative Homan's sign. No cords or calf tenderness. No significant calf/ankle edema.   Basename 02/10/12 1950  HGB 10.4*  HCT 31.8*    Assessment/Plan: Plan for discharge tomorrow, Breastfeeding, Lactation consult and Contraception interim BTL   LOS: 2 days   Marge Duncans 02/12/2012, 6:51 AM

## 2012-02-12 NOTE — Anesthesia Postprocedure Evaluation (Signed)
  Anesthesia Post-op Note  Patient: Alexis Robbins  Procedure(s) Performed: * No procedures listed *  Patient Location: PACU and Mother/Baby  Anesthesia Type:Epidural  Level of Consciousness: awake, alert  and oriented  Airway and Oxygen Therapy: Patient Spontanous Breathing  Post-op Pain: mild  Post-op Assessment: Patient's Cardiovascular Status Stable, Respiratory Function Stable, No signs of Nausea or vomiting, Adequate PO intake, Pain level controlled, No headache, No backache, No residual numbness and No residual motor weakness  Post-op Vital Signs: stable  Complications: No apparent anesthesia complications

## 2012-02-13 NOTE — Progress Notes (Signed)
Post Partum Day 2 Subjective: no complaints, up ad lib, voiding, tolerating PO and + flatus  Objective: Blood pressure 119/74, pulse 78, temperature 97.7 F (36.5 C), temperature source Oral, resp. rate 18, height 5\' 6"  (1.676 m), weight 194 lb (87.998 kg), SpO2 100.00%, unknown if currently breastfeeding.  Physical Exam:  General: alert, cooperative and no distress Lochia: appropriate Uterine Fundus: firm Incision:none DVT Evaluation: No evidence of DVT seen on physical exam.   Basename 02/10/12 1950  HGB 10.4*  HCT 31.8*    Assessment/Plan: Discharge home and Breastfeeding, possible tubal ligation or Mirena   LOS: 3 days   Henningsgaard, Bradley 02/13/2012, 7:37 AM   I have seen the patient and agree with the above.

## 2012-02-13 NOTE — Progress Notes (Signed)
Ur chart review completed.  

## 2012-02-13 NOTE — Discharge Summary (Signed)
  Post Partum Day 2 Subjective: no complaints, up ad lib, voiding, tolerating PO and + flatus  Objective: Blood pressure 119/74, pulse 78, temperature 97.7 F (36.5 C), temperature source Oral, resp. rate 18, height 5' 6" (1.676 m), weight 194 lb (87.998 kg), SpO2 100.00%, unknown if currently breastfeeding.  Physical Exam:  General: alert, cooperative and no distress Lochia: appropriate Uterine Fundus: firm Incision:none DVT Evaluation: No evidence of DVT seen on physical exam.   Basename 02/10/12 1950  HGB 10.4*  HCT 31.8*    Assessment/Plan: Discharge home and Breastfeeding, possible tubal ligation or Mirena   LOS: 2 days   Henningsgaard, Bradley 02/13/2012, 7:37 AM   I have seen the patient and agree with the above.    

## 2012-02-14 ENCOUNTER — Encounter (HOSPITAL_COMMUNITY): Payer: Self-pay | Admitting: *Deleted

## 2012-04-15 ENCOUNTER — Encounter (HOSPITAL_BASED_OUTPATIENT_CLINIC_OR_DEPARTMENT_OTHER): Payer: Self-pay

## 2012-04-15 ENCOUNTER — Emergency Department (HOSPITAL_BASED_OUTPATIENT_CLINIC_OR_DEPARTMENT_OTHER)
Admission: EM | Admit: 2012-04-15 | Discharge: 2012-04-16 | Disposition: A | Discharge status: Home / Self Care | Payer: Medicaid Other | Attending: Emergency Medicine | Admitting: Emergency Medicine

## 2012-04-15 DIAGNOSIS — D649 Anemia, unspecified: Secondary | ICD-10-CM | POA: Insufficient documentation

## 2012-04-15 DIAGNOSIS — M79609 Pain in unspecified limb: Secondary | ICD-10-CM | POA: Insufficient documentation

## 2012-04-15 NOTE — ED Notes (Signed)
Patient reports that she has had left lower leg swelling x 2 years intermittently. Reports for the past day has had pain in calf as well, denies trauma. No redness or warmth noted.

## 2012-04-15 NOTE — ED Notes (Signed)
EMS transport- ambulatory- c/o leg pain- taken to triage

## 2012-04-15 NOTE — ED Provider Notes (Signed)
History  This chart was scribed for Azaryah Heathcock Smitty Cords, MD by Shari Heritage, ED Scribe. The patient was seen in room MH01/MH01. Patient's care was started at 2330.   CSN: [id number]  Arrival date & time 04/15/12  2222   First MD Initiated Contact with Patient 04/15/12 2330      Chief Complaint  Patient presents with  . Leg Pain     Patient is a 27 y.o. female presenting with leg pain. The history is provided by the patient. No language interpreter was used.  Leg Pain Location:  Leg Leg location:  L lower leg Pain details:    Quality:  Aching   Radiates to:  Does not radiate   Severity:  Moderate   Onset quality:  Sudden   Duration:  1 day   Timing:  Constant   Progression:  Unchanged Chronicity:  New Foreign body present:  No foreign bodies Associated symptoms: no back pain, no fever, no muscle weakness, no numbness and no tingling     HPI Comments: Alexis Robbins is a 27 y.o. female who presents to the Emergency Department complaining of sudden, moderate, constant, non-radiating left calf pain onset yesterday. She states a history of recurrent leg leg swelling for the past 2 years. There is no redness to the area. No numbness, weakness or tingling to the area.  No back pain, fever, chills, nausea or vomiting. She denies any other pain at this time. She reports no recent injuries or traumas to the left leg. Patient gave birth in January. She reports no other significant past medical history. She does not smoke.     Past Medical History  Diagnosis Date  . Pregnant   . Anemia   . Intrahepatic cholestasis of pregnancy, antepartum 02/10/2012    Induction of labor    Past Surgical History  Procedure Laterality Date  . Cesarean section      No family history on file.  History  Substance Use Topics  . Smoking status: Never Smoker   . Smokeless tobacco: Never Used  . Alcohol Use:     OB History   Grav Para Term Preterm Abortions TAB SAB Ect Mult Living   3 3 3        3       Review of Systems  Constitutional: Negative for fever and chills.  Gastrointestinal: Negative for nausea and vomiting.  Musculoskeletal: Negative for back pain.  Neurological: Negative for weakness and numbness.  All other systems reviewed and are negative.    Allergies  Review of patient's allergies indicates no known allergies.  Home Medications   Current Outpatient Rx  Name  Route  Sig  Dispense  Refill  . FERROUS SULFATE PO   Oral   Take 1 tablet by mouth daily. OTC Fe         . Prenatal Vit-Fe Fumarate-FA (PRENATAL MULTIVITAMIN) TABS   Oral   Take 1 tablet by mouth every morning.           Triage Vitals: BP 118/98  Pulse 67  Temp(Src) 98.6 F (37 C) (Oral)  Resp 18  SpO2 100%  Physical Exam  Constitutional: She is oriented to person, place, and time. She appears well-developed and well-nourished.  HENT:  Head: Normocephalic and atraumatic.  Mouth/Throat: Oropharynx is clear and moist and mucous membranes are normal. Mucous membranes are not dry.  Eyes: Pupils are equal, round, and reactive to light.  Neck: Normal range of motion. Neck supple.  Cardiovascular: Normal rate and  regular rhythm.   Pulmonary/Chest: Effort normal and breath sounds normal.  Abdominal: Soft. Bowel sounds are normal. There is no tenderness.  Musculoskeletal: She exhibits no edema and no tenderness.  Intact achilles tendon. Cap refill less than 2 second. Intact DP pulses. All 5 compartments are soft. No baker's cyst. Negative anterior and posterior drawer tests. Quadriceps and patellar tendons intact.  Neurological: She is alert and oriented to person, place, and time. She has normal reflexes.  Skin: Skin is warm and dry.  Psychiatric: She has a normal mood and affect.    ED Course  Procedures (including critical care time) DIAGNOSTIC STUDIES: Oxygen Saturation is 100% on room air, normal by my interpretation.    COORDINATION OF CARE: 11:43 PM- Patient informed of  current plan for treatment and evaluation and agrees with plan at this time.      Labs Reviewed  D-DIMER, QUANTITATIVE   No results found.   No diagnosis found.    MDM  Negative ddimer.  Recently ruled out for DVT.  Refer back to her family doctor for ongoing care    I personally performed the services described in this documentation, which was scribed in my presence. The recorded information has been reviewed and is accurate.    Jasmine Awe, MD 04/16/12 [phone]

## 2012-04-16 ENCOUNTER — Encounter: Payer: Self-pay | Admitting: *Deleted

## 2012-04-16 MED ORDER — NAPROXEN 375 MG PO TABS: 375.0000 mg | ORAL_TABLET | Freq: Two times a day (BID) | ORAL | Status: DC

## 2012-05-07 ENCOUNTER — Encounter: Payer: Medicaid Other | Admitting: Obstetrics & Gynecology

## 2012-07-19 ENCOUNTER — Encounter (HOSPITAL_BASED_OUTPATIENT_CLINIC_OR_DEPARTMENT_OTHER): Payer: Self-pay | Admitting: *Deleted

## 2012-07-19 ENCOUNTER — Emergency Department (HOSPITAL_BASED_OUTPATIENT_CLINIC_OR_DEPARTMENT_OTHER)
Admission: EM | Admit: 2012-07-19 | Discharge: 2012-07-19 | Disposition: A | Discharge status: Home / Self Care | Payer: Self-pay | Attending: Emergency Medicine | Admitting: Emergency Medicine

## 2012-07-19 DIAGNOSIS — R059 Cough, unspecified: Secondary | ICD-10-CM | POA: Insufficient documentation

## 2012-07-19 DIAGNOSIS — H9209 Otalgia, unspecified ear: Secondary | ICD-10-CM | POA: Insufficient documentation

## 2012-07-19 DIAGNOSIS — R51 Headache: Secondary | ICD-10-CM | POA: Insufficient documentation

## 2012-07-19 DIAGNOSIS — Z791 Long term (current) use of non-steroidal anti-inflammatories (NSAID): Secondary | ICD-10-CM | POA: Insufficient documentation

## 2012-07-19 DIAGNOSIS — J069 Acute upper respiratory infection, unspecified: Secondary | ICD-10-CM | POA: Insufficient documentation

## 2012-07-19 DIAGNOSIS — J019 Acute sinusitis, unspecified: Secondary | ICD-10-CM | POA: Insufficient documentation

## 2012-07-19 DIAGNOSIS — Z862 Personal history of diseases of the blood and blood-forming organs and certain disorders involving the immune mechanism: Secondary | ICD-10-CM | POA: Insufficient documentation

## 2012-07-19 DIAGNOSIS — R05 Cough: Secondary | ICD-10-CM | POA: Insufficient documentation

## 2012-07-19 MED ORDER — OXYMETAZOLINE HCL 0.05 % NA SOLN
1.0000 | Freq: Three times a day (TID) | NASAL | Status: DC | PRN
  Administered 2012-07-19: 1 via NASAL
  Filled 2012-07-19: qty 15

## 2012-07-19 MED ORDER — AZITHROMYCIN 250 MG PO TABS: 250.0000 mg | ORAL_TABLET | Freq: Every day | ORAL | Status: DC

## 2012-07-19 NOTE — ED Provider Notes (Addendum)
History     CSN: [id number]  Arrival date & time 07/19/12  1924   First MD Initiated Contact with Patient 07/19/12 1941      Chief Complaint  Patient presents with  . URI    (Consider location/radiation/quality/duration/timing/severity/associated sxs/prior treatment) Patient is a 27 y.o. female presenting with URI. The history is provided by the patient.  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 Anemia   . Intrahepatic cholestasis of pregnancy, antepartum 02/10/2012    Induction of labor    Past Surgical History  Procedure Laterality Date  . Cesarean section      No family history on file.  History  Substance Use Topics  . Smoking status: Never Smoker   . Smokeless tobacco: Never Used  . Alcohol Use: Yes    OB History   Grav Para Term Preterm Abortions TAB SAB Ect Mult Living   3 3 3       3       Review of Systems  Constitutional: Negative for fever.  HENT: Positive for ear pain, congestion and rhinorrhea. Negative for sore throat and neck pain.   Respiratory: Positive for cough. Negative for wheezing.   Musculoskeletal: Negative for myalgias.  Neurological: Negative for headaches.  All other systems reviewed and are negative.    Allergies  Review of patient's allergies indicates no known allergies.  Home Medications   Current Outpatient Rx  Name  Route  Sig  Dispense   Refill  . FERROUS SULFATE PO   Oral   Take 1 tablet by mouth daily. OTC Fe         . naproxen (NAPROSYN) 375 MG tablet   Oral   Take 1 tablet (375 mg total) by mouth 2 (two) times daily.   20 tablet   0   . Prenatal Vit-Fe Fumarate-FA (PRENATAL MULTIVITAMIN) TABS   Oral   Take 1 tablet by mouth every morning.           BP 126/103  Pulse 83  Temp(Src) 98.6 F (37 C) (Oral)  Resp 18  Wt 179 lb (81.194 kg)  BMI 28.91 kg/m2  SpO2 99%  Physical Exam  Nursing note and vitals reviewed. Constitutional: She is oriented to person, place, and time. She appears well-developed and well-nourished. No distress.  HENT:  Head: Normocephalic and atraumatic.  Right Ear: Tympanic membrane and ear canal normal.  Left Ear: Tympanic membrane and ear canal normal.  Nose: Mucosal edema and rhinorrhea present. Right sinus exhibits maxillary sinus tenderness and frontal sinus tenderness. Left sinus exhibits maxillary sinus tenderness and frontal sinus tenderness.  Mouth/Throat: Uvula is midline, oropharynx is clear and moist and mucous membranes are normal.  Eyes: Conjunctivae and EOM are normal. Pupils are equal, round, and reactive to light.  Neck: Normal range of motion. Neck supple.  Cardiovascular: Normal rate, regular rhythm and intact distal pulses.   No  murmur heard. Pulmonary/Chest: Effort normal and breath sounds normal. No respiratory distress. She has no wheezes. She has no rales.  Musculoskeletal: Normal range of motion. She exhibits no edema and no tenderness.  Neurological: She is alert and oriented to person, place, and time.  Skin: Skin is warm and dry. No rash noted. No erythema.  Psychiatric: She has a normal mood and affect. Her behavior is normal.    ED Course  Procedures (including critical care time)  Labs Reviewed - No data to display No results found.   1. Acute sinusitis       MDM    Patient with symptoms consistent with a sinus infection with 9 days of  worsening sinus congestion and changing in her nasal discharge. She denies any chest pain, productive cough or shortness of breath. Exam findings consistent with sinus pressure, swollen nasal turbinates otherwise benign.  Patient given azithromycin, afrin followup.        Gwyneth Sprout, MD 07/19/12 1956  Gwyneth Sprout, MD 07/19/12 [phone]

## 2012-07-19 NOTE — ED Notes (Signed)
Cold symptoms x 9 days.

## 2013-11-06 IMAGING — US US OB DETAIL+14 WK
1 series · 12 of 28 positions shown · non-contrast
Comparison: none

[Series 1: us ob detail +14 wk · 12 of 91 slices shown]
[im 4/91]
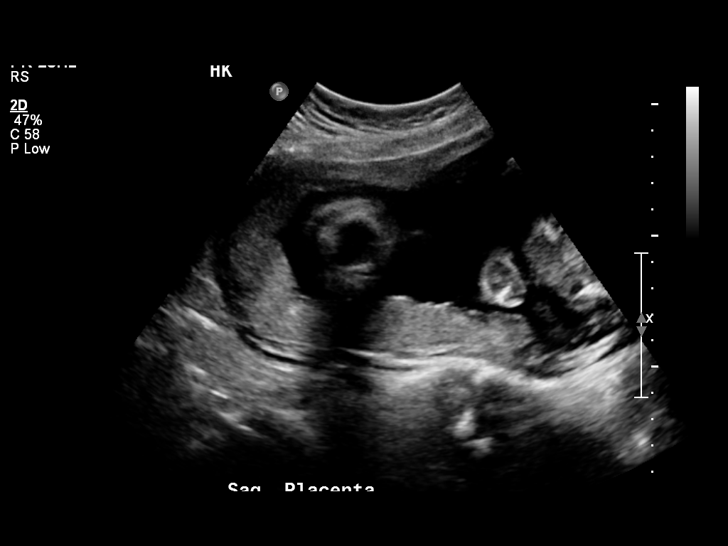
[im 11/91]
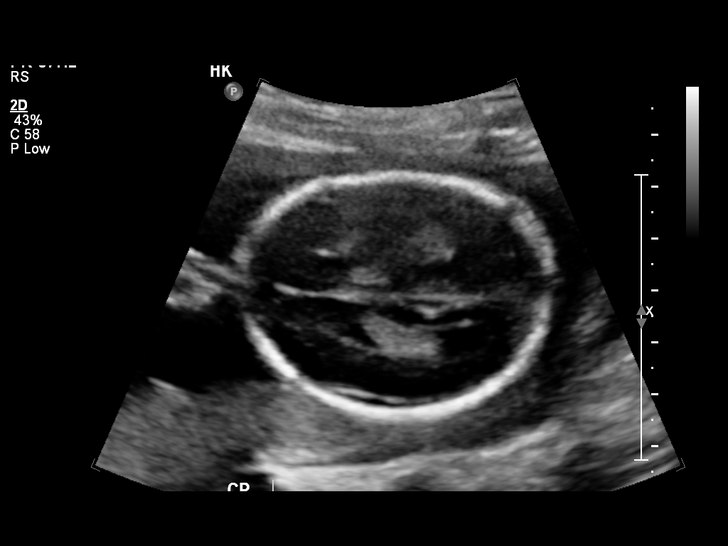
[im 17/91]
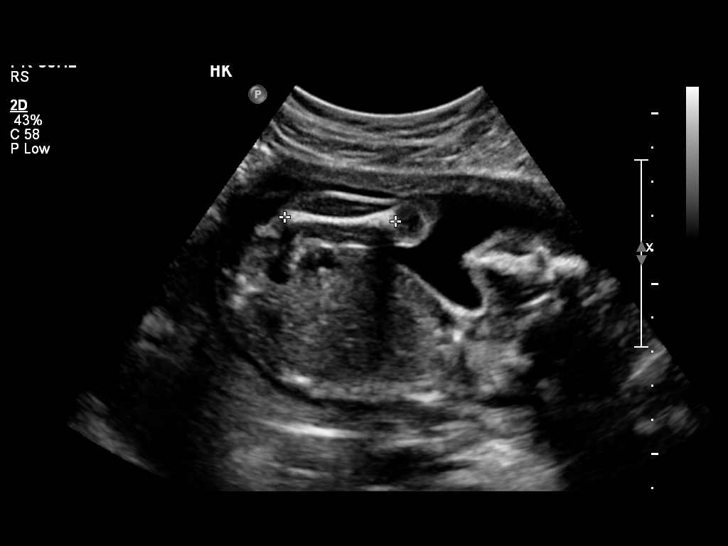
[im 27/91]
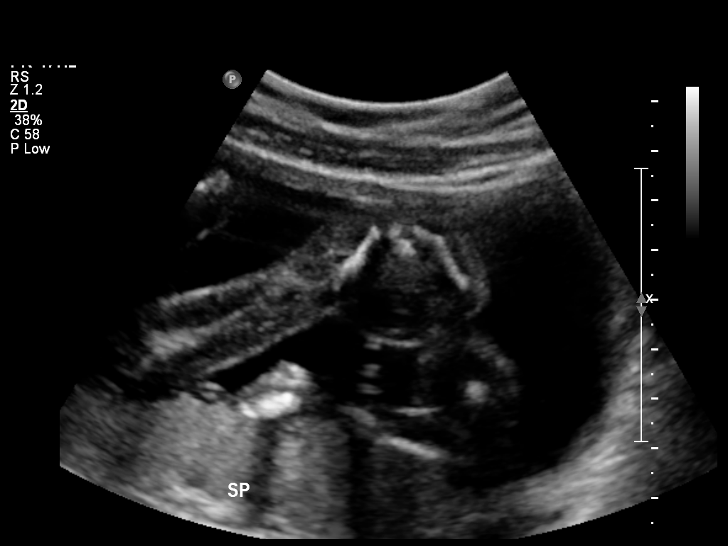
[im 34/91]
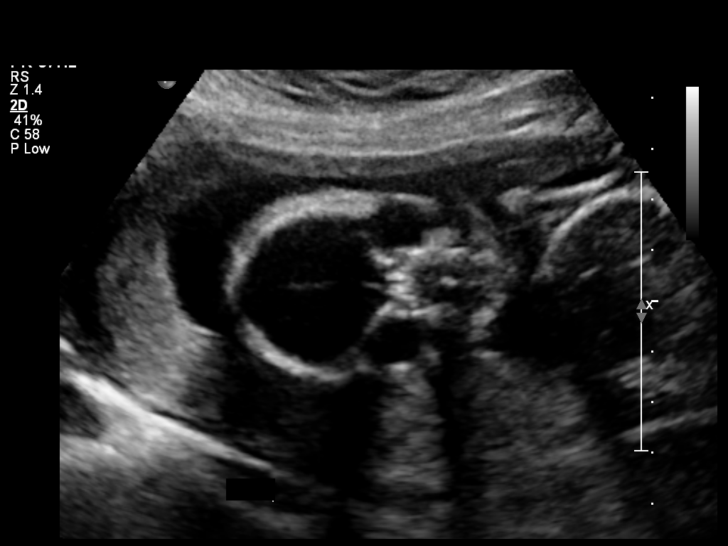
[im 41/91]
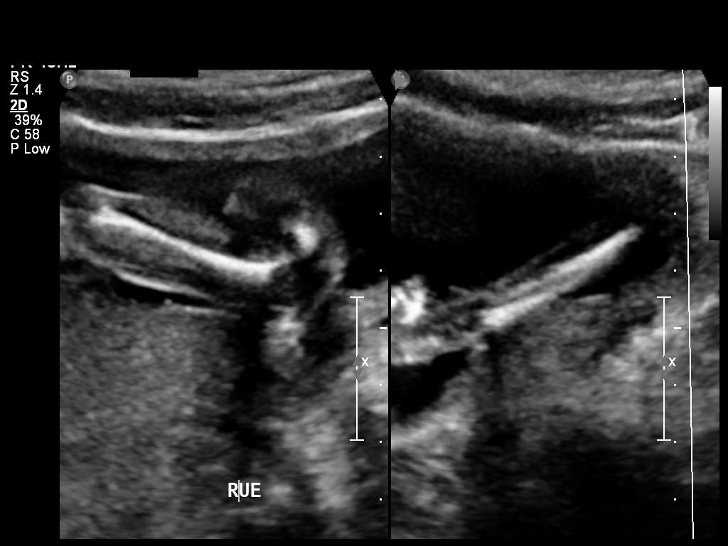
[im 51/91]
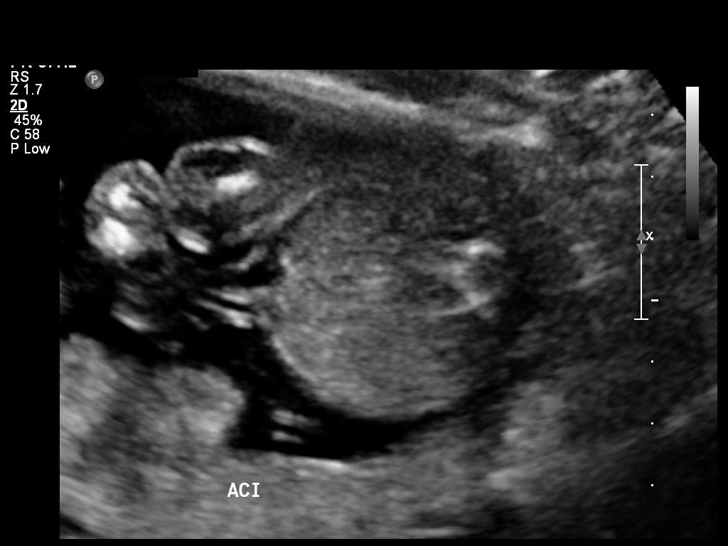
[im 57/91]
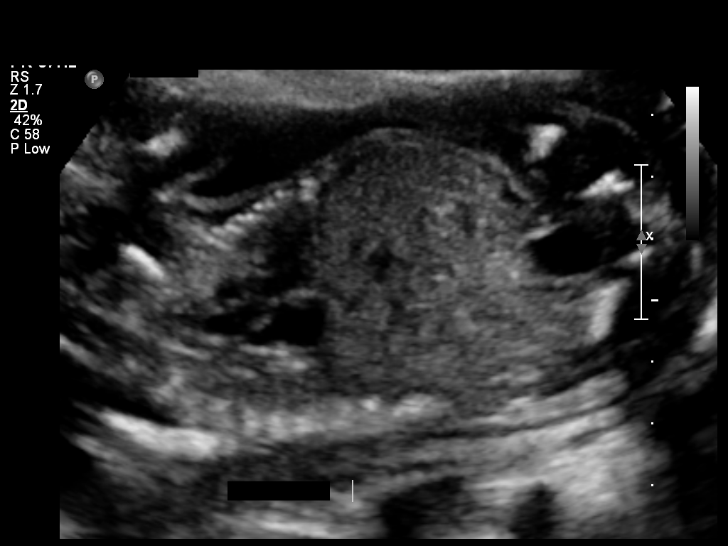
[im 64/91]
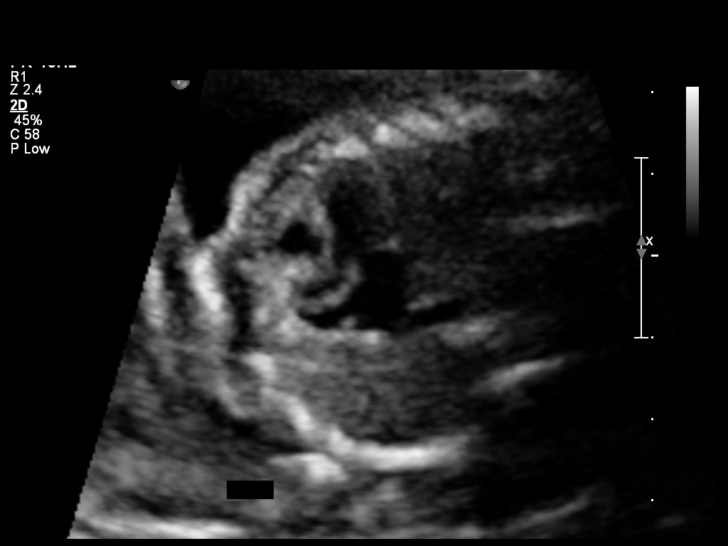
[im 74/91]
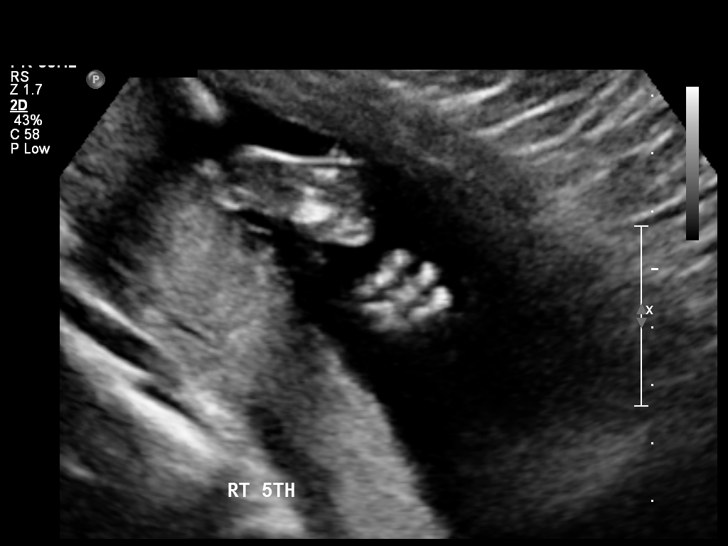
[im 81/91]
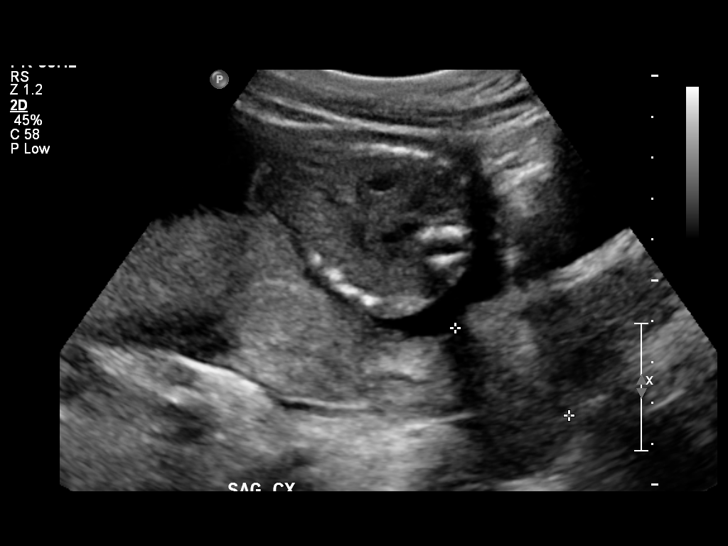
[im 87/91]
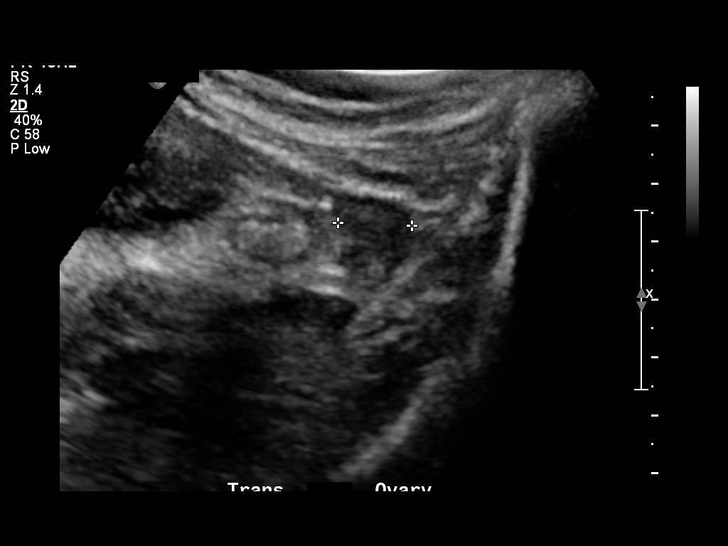

[12 of 28 positions shown; findings below may reference images not displayed]

OBSTETRICS REPORT
                      (Signed Final 10/14/2011 [DATE])

 Order#:         [id number]
Procedures

 US OB DETAIL + 14 WK                                  76811.0
Indications

 Detailed fetal anatomic survey
Fetal Evaluation

 Fetal Heart Rate:  146                         bpm
 Cardiac Activity:  Observed
 Presentation:      Breech
 Placenta:          Posterior Fundal, above
                    cervical os
 P. Cord            Appears WNL
 Insertion:

 Amniotic Fluid
 AFI FV:      Subjectively within normal limits
Biometry

 BPD:     42.9  mm    G. Age:   19w 0d                CI:        63.16   70 - 86
                                                      FL/HC:      18.6   16.8 -

 HC:       174  mm    G. Age:   19w 6d       33  %    HC/AC:      1.12   1.09 -

 AC:       156  mm    G. Age:   20w 6d       65  %    FL/BPD:
 FL:      32.4  mm    G. Age:   20w 1d       41  %    FL/AC:      20.8   20 - 24
 HUM:       31  mm    G. Age:   20w 2d       57  %
 NFT:     3.59  mm

 Est. FW:     348  gm    0 lb 12 oz      52  %
Gestational Age

 LMP:           20w 1d       Date:   05/26/11                 EDD:   03/01/12
 U/S Today:     20w 0d                                        EDD:   03/02/12
 Best:          20w 1d    Det. By:   LMP  (05/26/11)          EDD:   03/01/12
Genetic Sonogram - Trisomy 21 Screening
 Age:                                             37          Risk=1:   145
 Echogenic bowel:                                 No
 Choroid plexus cysts:                            No
 Structural anomalies (inc. cardiac):             No
 Hypoplastic / absent midphalanx 5th Digit:       No
 Wide space 2st-1nd toes:                         No
 2-vessel umbilical cord:                         No
 Pyelectasis:                                     No
 Echogenic cardiac foci:                          No
Anatomy

 Cranium:           Appears normal      Aortic Arch:       Appears normal
 Fetal Cavum:       Appears normal      Ductal Arch:       Not well
                                                           visualized
 Ventricles:        Appears normal      Diaphragm:         Appears normal
 Choroid Plexus:    Appears normal      Stomach:           Appears
                                                           normal, left
                                                           sided
 Cerebellum:        Appears normal      Abdomen:           Appears normal
 Posterior Fossa:   Appears normal      Abdominal Wall:    Appears nml
                                                           (cord insert,
                                                           abd wall)
 Nuchal Fold:       Appears normal      Cord Vessels:      Appears normal
                    (neck, nuchal                          (3 vessel cord)
                    fold)
 Face:              Appears normal      Kidneys:           Appear normal
                    (lips/profile/orbit
                    s)
 Heart:             Appears normal      Bladder:           Appears normal
                    (4 chamber &
                    axis)
 RVOT:              Appears normal      Spine:             Appears normal
 LVOT:              Appears normal      Limbs:             Appears normal
                                                           (hands, ankles,
                                                           feet)

 Other:     Male gender. Nasal bone visualized. Heels and 5th digit
            visualized.
Cervix Uterus Adnexa

 Cervical Length:   3.4       cm

 Cervix:       Normal appearance by transabdominal scan.
 Left Ovary:   Within normal limits.
 Right Ovary:  Not visualized.

 Adnexa:     No abnormality visualized.
Impression

 Siup demonstrating an EGA by ultrasound of 20w 0d. This
 corresponds well with expected EGA by LMP of 20w 1d.

 No focal fetal or placental abnormalities are noted with a
 good anatomic evaluation possible. No soft markers for Down
 Syndrome are seen.

 Subjectively and quantitatively normal amniotic fluid volume.
 Normal cervical length.

## 2013-12-02 ENCOUNTER — Encounter (HOSPITAL_BASED_OUTPATIENT_CLINIC_OR_DEPARTMENT_OTHER): Payer: Self-pay | Admitting: *Deleted

## 2014-06-20 ENCOUNTER — Other Ambulatory Visit: Payer: Self-pay | Admitting: Obstetrics & Gynecology

## 2014-06-20 ENCOUNTER — Other Ambulatory Visit (HOSPITAL_COMMUNITY)
Admission: RE | Admit: 2014-06-20 | Discharge: 2014-06-20 | Disposition: A | Discharge status: Home / Self Care | Payer: BC Managed Care – PPO | Source: Ambulatory Visit | Attending: Obstetrics and Gynecology | Admitting: Obstetrics and Gynecology

## 2014-06-20 DIAGNOSIS — Z01419 Encounter for gynecological examination (general) (routine) without abnormal findings: Secondary | ICD-10-CM | POA: Diagnosis present

## 2014-06-23 LAB — CYTOLOGY - PAP

## 2016-02-01 NOTE — L&D Delivery Note (Signed)
Delivery Note At 5:33 PM a viable female, "Pamelia HoitLevi"  was delivered via VBAC, Spontaneous (Presentation: direct OA ;  ).  APGAR: 8, 9; weight pending .   Placenta status: spontaneous, 3VC Cord:  with the following complications: Nuchal cord x 1, manually reduced.  Cord pH: n/a  Anesthesia:  None Episiotomy: None Lacerations: right Labial,Laceration b/w labia majora and minora Suture Repair: n/a Est. Blood Loss (mL): 100  Mom to postpartum.  Baby to Couplet care / Skin to Skin.  Geryl RankinsVARNADO, Ivelise Castillo 11/24/2016, 6:35 PM

## 2016-04-15 ENCOUNTER — Other Ambulatory Visit: Payer: Self-pay | Admitting: Obstetrics and Gynecology

## 2016-04-15 ENCOUNTER — Other Ambulatory Visit (HOSPITAL_COMMUNITY)
Admission: RE | Admit: 2016-04-15 | Discharge: 2016-04-15 | Disposition: A | Discharge status: Home / Self Care | Payer: BC Managed Care – PPO | Source: Ambulatory Visit | Attending: Obstetrics and Gynecology | Admitting: Obstetrics and Gynecology

## 2016-04-15 DIAGNOSIS — Z01419 Encounter for gynecological examination (general) (routine) without abnormal findings: Secondary | ICD-10-CM | POA: Insufficient documentation

## 2016-04-15 DIAGNOSIS — Z1151 Encounter for screening for human papillomavirus (HPV): Secondary | ICD-10-CM | POA: Insufficient documentation

## 2016-04-15 DIAGNOSIS — Z113 Encounter for screening for infections with a predominantly sexual mode of transmission: Secondary | ICD-10-CM | POA: Insufficient documentation

## 2016-04-15 LAB — OB RESULTS CONSOLE ANTIBODY SCREEN: Antibody Screen: NEGATIVE

## 2016-04-15 LAB — OB RESULTS CONSOLE GC/CHLAMYDIA
Chlamydia: NEGATIVE
Gonorrhea: NEGATIVE

## 2016-04-15 LAB — OB RESULTS CONSOLE HIV ANTIBODY (ROUTINE TESTING): HIV: NONREACTIVE

## 2016-04-15 LAB — OB RESULTS CONSOLE ABO/RH: RH TYPE: POSITIVE

## 2016-04-15 LAB — OB RESULTS CONSOLE RPR: RPR: NONREACTIVE

## 2016-04-15 LAB — OB RESULTS CONSOLE RUBELLA ANTIBODY, IGM: RUBELLA: IMMUNE

## 2016-04-15 LAB — OB RESULTS CONSOLE HEPATITIS B SURFACE ANTIGEN: HEP B S AG: NEGATIVE

## 2016-04-18 ENCOUNTER — Other Ambulatory Visit (HOSPITAL_COMMUNITY): Payer: Self-pay | Admitting: Obstetrics and Gynecology

## 2016-04-18 ENCOUNTER — Ambulatory Visit (HOSPITAL_COMMUNITY)
Admission: RE | Admit: 2016-04-18 | Discharge: 2016-04-18 | Disposition: A | Discharge status: Home / Self Care | Payer: BC Managed Care – PPO | Source: Ambulatory Visit | Attending: Obstetrics and Gynecology | Admitting: Obstetrics and Gynecology

## 2016-04-18 DIAGNOSIS — O283 Abnormal ultrasonic finding on antenatal screening of mother: Secondary | ICD-10-CM | POA: Insufficient documentation

## 2016-04-18 DIAGNOSIS — IMO0002 Reserved for concepts with insufficient information to code with codable children: Secondary | ICD-10-CM

## 2016-04-18 DIAGNOSIS — Z3A08 8 weeks gestation of pregnancy: Secondary | ICD-10-CM | POA: Insufficient documentation

## 2016-04-20 LAB — CYTOLOGY - PAP
Diagnosis: NEGATIVE
HPV: NOT DETECTED

## 2016-08-09 ENCOUNTER — Inpatient Hospital Stay (HOSPITAL_COMMUNITY)
Admission: AD | Admit: 2016-08-09 | Discharge: 2016-08-09 | Disposition: A | Discharge status: Home / Self Care | Payer: BC Managed Care – PPO | Source: Ambulatory Visit | Attending: Obstetrics and Gynecology | Admitting: Obstetrics and Gynecology

## 2016-08-09 ENCOUNTER — Encounter (HOSPITAL_COMMUNITY): Payer: Self-pay

## 2016-08-09 DIAGNOSIS — Z8759 Personal history of other complications of pregnancy, childbirth and the puerperium: Secondary | ICD-10-CM | POA: Diagnosis not present

## 2016-08-09 DIAGNOSIS — Z3689 Encounter for other specified antenatal screening: Secondary | ICD-10-CM | POA: Diagnosis not present

## 2016-08-09 DIAGNOSIS — Z9889 Other specified postprocedural states: Secondary | ICD-10-CM | POA: Diagnosis not present

## 2016-08-09 DIAGNOSIS — O368120 Decreased fetal movements, second trimester, not applicable or unspecified: Secondary | ICD-10-CM | POA: Insufficient documentation

## 2016-08-09 DIAGNOSIS — O26892 Other specified pregnancy related conditions, second trimester: Secondary | ICD-10-CM | POA: Diagnosis not present

## 2016-08-09 DIAGNOSIS — Z3A24 24 weeks gestation of pregnancy: Secondary | ICD-10-CM | POA: Diagnosis not present

## 2016-08-09 DIAGNOSIS — L299 Pruritus, unspecified: Secondary | ICD-10-CM | POA: Diagnosis not present

## 2016-08-09 LAB — CBC WITH DIFFERENTIAL/PLATELET
Basophils Absolute: 0 10*3/uL (ref 0.0–0.1)
Basophils Relative: 0 %
Eosinophils Absolute: 0 10*3/uL (ref 0.0–0.7)
Eosinophils Relative: 0 %
HEMATOCRIT: 35.2 % — AB (ref 36.0–46.0)
HEMOGLOBIN: 11.6 g/dL — AB (ref 12.0–15.0)
LYMPHS ABS: 2.2 10*3/uL (ref 0.7–4.0)
LYMPHS PCT: 22 %
MCH: 30.4 pg (ref 26.0–34.0)
MCHC: 33 g/dL (ref 30.0–36.0)
MCV: 92.1 fL (ref 78.0–100.0)
MONOS PCT: 3 %
Monocytes Absolute: 0.3 10*3/uL (ref 0.1–1.0)
NEUTROS PCT: 75 %
Neutro Abs: 7.4 10*3/uL (ref 1.7–7.7)
Platelets: 221 10*3/uL (ref 150–400)
RBC: 3.82 MIL/uL — AB (ref 3.87–5.11)
RDW: 14.5 % (ref 11.5–15.5)
WBC: 9.8 10*3/uL (ref 4.0–10.5)

## 2016-08-09 LAB — COMPREHENSIVE METABOLIC PANEL
ALT: 11 U/L — ABNORMAL LOW (ref 14–54)
AST: 17 U/L (ref 15–41)
Albumin: 3.1 g/dL — ABNORMAL LOW (ref 3.5–5.0)
Alkaline Phosphatase: 69 U/L (ref 38–126)
Anion gap: 6 (ref 5–15)
BUN: 8 mg/dL (ref 6–20)
CHLORIDE: 104 mmol/L (ref 101–111)
CO2: 23 mmol/L (ref 22–32)
Calcium: 8.2 mg/dL — ABNORMAL LOW (ref 8.9–10.3)
Creatinine, Ser: 0.54 mg/dL (ref 0.44–1.00)
Glucose, Bld: 72 mg/dL (ref 65–99)
POTASSIUM: 4.1 mmol/L (ref 3.5–5.1)
SODIUM: 133 mmol/L — AB (ref 135–145)
Total Bilirubin: 0.3 mg/dL (ref 0.3–1.2)
Total Protein: 6.6 g/dL (ref 6.5–8.1)

## 2016-08-09 MED ORDER — HYDROXYZINE HCL 25 MG PO TABS: 25.0000 mg | ORAL_TABLET | Freq: Three times a day (TID) | ORAL | 0 refills | Status: DC | PRN

## 2016-08-09 NOTE — MAU Note (Addendum)
Sent from office due to questionable variable. Denies bleeding. + FM. EFM applied. Also sent here for labs to r/o cholestasias. VSS see flow sheet for details.   Labs ordered.   1129: Provider notified. Report status of pt given. Orders received to order bio acid to r/o cholestastis.   1131: Bile Acid ordered and lab notified.   Provider reviewed NST and okay with strip for pt to go home once lab resulted.   1156: pending lab results. Pt made aware.   1220: dr. Omer Jackmumaw at bs. Discharge orders received.   1230: discharge instructions given with pt understanding. Pt left unit via ambulatory with family.

## 2016-08-09 NOTE — MAU Provider Note (Signed)
Chief Complaint:  Labs Only and Non-stress Test   First Provider Initiated Contact with Patient 08/09/16 1207      HPI: Alexis Robbins is a 31 y.o. G4P3003 at [redacted]w[redacted]d who presents to maternity admissions reporting decel heard in office.  Patient went into office this morning due to severe itching, she had cholestasis of pregnancy and the last pregnancy. While in the office patient was getting her Doppler heart tones performed and there was an auditory decels heard, multiple times. Therefore provider sent the patient in for a NST. Patient states she feels movement of baby. Denies any contractions or abdominal pain. She denies any leaking of fluid or vaginal bleeding. Denies any headaches, right upper quadrant or epigastric pain, or changes in vision.   Pregnancy Course:   Past Medical History: Past Medical History:  Diagnosis Date  . Anemia   . Intrahepatic cholestasis of pregnancy, antepartum 02/10/2012   Induction of labor    Past obstetric history: OB History  Gravida Para Term Preterm AB Living  4 3 3     3   SAB TAB Ectopic Multiple Live Births          1    # Outcome Date GA Lbr Len/2nd Weight Sex Delivery Anes PTL Lv  4 Current           3 Term 02/11/12 110w2d 00:42 / 00:34 6 lb 11.4 oz (3.045 kg) M VBAC EPI  LIV  2 Term           1 Term               Past Surgical History: Past Surgical History:  Procedure Laterality Date  . CESAREAN SECTION       Family History: No family history on file.  Social History: Social History  Substance Use Topics  . Smoking status: Never Smoker  . Smokeless tobacco: Never Used  . Alcohol use Yes    Allergies: No Known Allergies  Meds:  Prescriptions Prior to Admission  Medication Sig Dispense Refill Last Dose  . diphenhydrAMINE (BENADRYL) 25 MG tablet Take 25 mg by mouth every 6 (six) hours as needed for itching.   08/08/2016 at Unknown time  . Prenatal Vit-Fe Fumarate-FA (PRENATAL MULTIVITAMIN) TABS Take 1 tablet by mouth every  morning.   08/09/2016 at Unknown time  . FERROUS SULFATE PO Take 1 tablet by mouth daily. OTC Fe   04/14/2012 at Unknown    I have reviewed patient's Past Medical Hx, Surgical Hx, Family Hx, Social Hx, medications and allergies.   ROS:  A comprehensive ROS was negative except per HPI.    Physical Exam   Patient Vitals for the past 24 hrs:  BP Temp Temp src Pulse Resp  08/09/16 1226 118/66 98.2 F (36.8 C) Oral 68 18  08/09/16 1225 118/66 - - 68 -  08/09/16 1049 115/69 - - 74 -  08/09/16 1047 122/71 - - 73 -   Constitutional: Well-developed, well-nourished female in no acute distress.  Cardiovascular: normal rate, rhythm, no murmurs Respiratory: normal effort, CTAB GI: Abd soft, non-tender, gravid appropriate for gestational age. Pos BS x 4 MS: Extremities nontender, no edema, normal ROM Neurologic: Alert and oriented x 4.      Labs: Results for orders placed or performed during the hospital encounter of 08/09/16 (from the past 24 hour(s))  Comprehensive metabolic panel     Status: Abnormal   Collection Time: 08/09/16 11:12 AM  Result Value Ref Range   Sodium 133 (L)  135 - 145 mmol/L   Potassium 4.1 3.5 - 5.1 mmol/L   Chloride 104 101 - 111 mmol/L   CO2 23 22 - 32 mmol/L   Glucose, Bld 72 65 - 99 mg/dL   BUN 8 6 - 20 mg/dL   Creatinine, Ser 0.54 0.44 - 1.00 mg/dL   Calcium 8.2 (L) 8.9 - 10.3 mg/dL   Total Protein 6.6 6.4.095 - 8.1 g/dL   Albumin 3.1 (L) 3.5 - 5.0 g/dL   AST 17 15 - 41 U/L   ALT 11 (L) 14 - 54 U/L   Alkaline Phosphatase 69 38 - 126 U/L   Total Bilirubin 0.3 0.3 - 1.2 mg/dL   GFR calc non Af Amer >60 >60 mL/min   GFR calc Af Amer >60 >60 mL/min   Anion gap 6 5 - 15  CBC with Differential     Status: Abnormal   Collection Time: 08/09/16 11:12 AM  Result Value Ref Range   WBC 9.8 4.0 - 10.5 K/uL   RBC 3.82 (L) 3.87 - 5.11 MIL/uL   Hemoglobin 11.6 (L) 12.0 - 15.0 g/dL   HCT 81.135.2 (L) 91.436.0 - 78.246.0 %   MCV 92.1 78.0 - 100.0 fL   MCH 30.4 26.0 - 34.0 pg    MCHC 33.0 30.0 - 36.0 g/dL   RDW 95.614.5 21.311.5 - 08.615.5 %   Platelets 221 150 - 400 K/uL   Neutrophils Relative % 75 %   Neutro Abs 7.4 1.7 - 7.7 K/uL   Lymphocytes Relative 22 %   Lymphs Abs 2.2 0.7 - 4.0 K/uL   Monocytes Relative 3 %   Monocytes Absolute 0.3 0.1 - 1.0 K/uL   Eosinophils Relative 0 %   Eosinophils Absolute 0.0 0.0 - 0.7 K/uL   Basophils Relative 0 %   Basophils Absolute 0.0 0.0 - 0.1 K/uL    Imaging:  No results found.  MAU Course: NST - reactive CBC - mild anemia CMP - normal Bile Acids  12:17 PM - Spoke with Dr. Richardson Doppole, NST is reactive, baby's well being looks reassuring. Labs drawn for cholestasis of pregnancy-bile acids-are pending (CBC and CMP normal). To go home with Vistaril per Dr. Richardson Doppole. OK for discharge  I personally reviewed the patient's NST today, found to be REACTIVE. 150 bpm, mod var, +accels, no decels.    MDM: Plan of care reviewed with patient, including labs and tests ordered and medical treatment. Spoke with Dr. Richardson Doppole who agrees okay for discharge due to reassuring fetal status with a reactive NST. Dr. Richardson Doppole requested provider to sent home on Vistaril for her itching. She has an appointment set up on 7/20.   Assessment: 1. NST (non-stress test) reactive   2. Itching     Plan: Discharge home in stable condition.  Preterm Labor precautions and fetal kick counts Follow up with OB provider and for bile acids results Vistaril to go home with for itching prn   Allergies as of 08/09/2016   No Known Allergies     Medication List    TAKE these medications   diphenhydrAMINE 25 MG tablet Commonly known as:  BENADRYL Take 25 mg by mouth every 6 (six) hours as needed for itching.   FERROUS SULFATE PO Take 1 tablet by mouth daily. OTC Fe   hydrOXYzine 25 MG tablet Commonly known as:  ATARAX/VISTARIL Take 1 tablet (25 mg total) by mouth every 8 (eight) hours as needed for itching.   prenatal multivitamin Tabs tablet Take 1 tablet by mouth  every morning.       Jen Mow, DO [facility], [facility] 08/09/2016 12:07 PM

## 2016-08-09 NOTE — Discharge Instructions (Signed)
Cholestasis of Pregnancy Cholestasis refers to any condition that causes the flow of digestive fluid (bile) produced by the liver to slow down or stop. Cholestasis of pregnancy is most common toward the end of pregnancy (thirdtrimester), but it can occur any time during pregnancy. The condition often goes away soon after giving birth. Cholestasis may be uncomfortable, but it is usually harmless to you. However, it can be harmful to your baby. Cholestasis may increase the risk of:  Your baby being born too early (preterm delivery).  Your baby having a slow heart rate and lack of oxygen during delivery (fetal distress).  Losing your baby before delivery (stillbirth).  What are the causes? The exact cause of this condition is not known, but it may be related to:  Pregnancy hormones. The gallbladder normally holds bile until you need it to help digest fat in your diet. Pregnancy hormones may cause the flow of bile to slow down and back up into your liver. Bile may then get into your bloodstream and cause cholestasis symptoms.  Changes in your genes (genetic mutations). Specifically, genes that affect how the liver releases bile.  What increases the risk? You are more likely to develop this condition if:  You had cholestasis during a previous pregnancy.  You have a family history of cholestasis.  You have liver problems.  You are having multiple babies, such as twins or triplets.  What are the signs or symptoms? The most common symptom of this condition is intense itching (pruritus), especially on the palms of your hands and soles of your feet. The itching can spread to the rest of your body and is often worse at night. You will not usually have a rash. Other symptoms may include:  Feeling tired.  Pain in your upper right abdomen.  Dark-colored urine.  Light-colored stools.  Poor appetite.  Yellowish discoloration of your skin and the whites of your eyes (jaundice).  How is this  diagnosed? This condition is diagnosed based on:  Your medical history.  A physical exam.  Blood tests.  If you have an inherited risk for developing this condition, you may also have genetic testing. How is this treated? The goal of treatment is to make you comfortable and keep your baby safe. Your health care provider may:  Prescribe medicine to reduce bile acid in your bloodstream, relieve symptoms, and help keep your baby safe.  Give you vitamin K before delivery to prevent excessive bleeding.  Check your baby frequently (fetal monitoring).  Perform regular blood tests to check your bile levels and liver function until your baby is delivered.  Recommend starting (inducing) your labor and delivery by week 36 or 37 of pregnancy, or as soon as your baby's lungs have developed enough.  Follow these instructions at home:  Take over-the-counter and prescription medicines only as told by your health care provider.  Take cool baths to soothe itchy skin.  Wear comfortable, loose-fitting, cotton clothing to reduce itching.  Keep your fingernails short to prevent skin irritation from scratching.  Keep all follow-up visits and prenatal visits as told by your health care provider. This is important. Contact a health care provider if:  Your symptoms get worse, even with treatment.  You develop pain in your right side.  You have unusual swelling in your abdomen, feet, ankles, or legs.  You have a fever.  You are more thirsty than usual. Get help right away if:  You go into early labor.  You have a headache that   does not go away or causes changes in vision.  You have nausea or you vomit.  You have severe pain in your abdomen or shoulders.  You have shortness of breath. Summary  Cholestasis of pregnancy is most common toward the end of pregnancy (thirdtrimester), but it can occur any time during your pregnancy.  The condition often goes away soon after your baby is  born.  The most common symptom of cholestasis of pregnancy is intense itching (pruritus), especially on the palms of your hands and soles of your feet.  This condition may be treated with medicine, frequent monitoring, or starting (inducing) labor and delivery by week 36 or 37 of pregnancy. This information is not intended to replace advice given to you by your health care provider. Make sure you discuss any questions you have with your health care provider. Document Released: 01/15/2000 Document Revised: 01/02/2016 Document Reviewed: 01/02/2016 Elsevier Interactive Patient Education  2017 Elsevier Inc[employer] Nonstress Test The nonstress test is a procedure that monitors the fetus's heartbeat. The test will monitor the heartbeat when the fetus is at rest and while the fetus is moving. In a healthy fetus, there will be an increase in fetal heart rate when the fetus moves or kicks. The heart rate will decrease at rest. This test helps determine if the fetus is healthy. Your health care provider will look at a number of patterns in the heart rate tracing to make sure your baby is thriving. If there is concern, your health care provider may order additional tests or may suggest another course of action. This test is often done in the third trimester and can help determine if an early delivery is needed and safe. Common reasons to have this test are:  You are past your due date.  You have a high-risk pregnancy.  You are feeling less movement than normal.  You have lost a pregnancy in the past.  Your health care provider suspects fetal growth problems.  You have too much or too little amniotic fluid.  What happens before the procedure?  Eat a meal right before the test or as directed by your health care provider. Food may help stimulate fetal movements.  Use the restroom right before the test. What happens during the procedure?  Two belts will be placed around your abdomen. These belts have  monitors attached to them. One records the fetal heart rate and the other records uterine contractions.  You may be asked to lie down on your side or to stay sitting upright.  You may be given a button to press when you feel movement.  The fetal heartbeat is listened to and watched on a screen. The heartbeat is recorded on a sheet of paper.  If the fetus seems to be sleeping, you may be asked to drink some juice or soda, gently press your abdomen, or make some noise to wake the fetus. What happens after the procedure? Your health care provider will discuss the test results with you and make recommendations for the near future.  This information is not intended to replace advice given to you by your health care provider. Make sure you discuss any questions you have with your health care provider. This information is not intended to replace advice given to you by your health care provider. Make sure you discuss any questions you have with your health care provider. Document Released: 01/07/2002 Document Revised: 12/18/2015 Document Reviewed: 02/21/2012 Elsevier Interactive Patient Education  [employer]2018 Elsevier Inc.

## 2016-08-10 LAB — BILE ACIDS, TOTAL: Bile Acids Total: 8.4 umol/L (ref 4.7–24.5)

## 2016-11-02 LAB — OB RESULTS CONSOLE GBS: GBS: NEGATIVE

## 2016-11-22 ENCOUNTER — Telehealth (HOSPITAL_COMMUNITY): Payer: Self-pay | Admitting: *Deleted

## 2016-11-22 ENCOUNTER — Encounter (HOSPITAL_COMMUNITY): Payer: Self-pay | Admitting: *Deleted

## 2016-11-22 NOTE — Telephone Encounter (Signed)
Preadmission screen  

## 2016-11-24 ENCOUNTER — Encounter (HOSPITAL_COMMUNITY): Payer: Self-pay | Admitting: *Deleted

## 2016-11-24 ENCOUNTER — Inpatient Hospital Stay (HOSPITAL_COMMUNITY)
Admission: AD | Admit: 2016-11-24 | Discharge: 2016-11-25 | DRG: 807 | Disposition: A | Discharge status: Home / Self Care | Payer: BC Managed Care – PPO | Source: Ambulatory Visit | Attending: Obstetrics and Gynecology | Admitting: Obstetrics and Gynecology

## 2016-11-24 DIAGNOSIS — O34211 Maternal care for low transverse scar from previous cesarean delivery: Principal | ICD-10-CM | POA: Diagnosis present

## 2016-11-24 DIAGNOSIS — Z3A39 39 weeks gestation of pregnancy: Secondary | ICD-10-CM | POA: Diagnosis not present

## 2016-11-24 LAB — COMPREHENSIVE METABOLIC PANEL
ALT: 11 U/L — AB (ref 14–54)
AST: 21 U/L (ref 15–41)
Albumin: 3.2 g/dL — ABNORMAL LOW (ref 3.5–5.0)
Alkaline Phosphatase: 302 U/L — ABNORMAL HIGH (ref 38–126)
Anion gap: 9 (ref 5–15)
BUN: 8 mg/dL (ref 6–20)
CALCIUM: 8.6 mg/dL — AB (ref 8.9–10.3)
CO2: 20 mmol/L — ABNORMAL LOW (ref 22–32)
CREATININE: 0.52 mg/dL (ref 0.44–1.00)
Chloride: 107 mmol/L (ref 101–111)
Glucose, Bld: 78 mg/dL (ref 65–99)
Potassium: 4.1 mmol/L (ref 3.5–5.1)
Sodium: 136 mmol/L (ref 135–145)
Total Bilirubin: 0.4 mg/dL (ref 0.3–1.2)
Total Protein: 6.8 g/dL (ref 6.5–8.1)

## 2016-11-24 LAB — TYPE AND SCREEN
ABO/RH(D): B POS
Antibody Screen: NEGATIVE

## 2016-11-24 LAB — PROTEIN / CREATININE RATIO, URINE
CREATININE, URINE: 121 mg/dL
Protein Creatinine Ratio: 0.11 mg/mg{Cre} (ref 0.00–0.15)
TOTAL PROTEIN, URINE: 13 mg/dL

## 2016-11-24 LAB — CBC
HCT: 32.8 % — ABNORMAL LOW (ref 36.0–46.0)
Hemoglobin: 10.8 g/dL — ABNORMAL LOW (ref 12.0–15.0)
MCH: 29.3 pg (ref 26.0–34.0)
MCHC: 32.9 g/dL (ref 30.0–36.0)
MCV: 89.1 fL (ref 78.0–100.0)
PLATELETS: 181 10*3/uL (ref 150–400)
RBC: 3.68 MIL/uL — ABNORMAL LOW (ref 3.87–5.11)
RDW: 15.6 % — AB (ref 11.5–15.5)
WBC: 9.2 10*3/uL (ref 4.0–10.5)

## 2016-11-24 MED ORDER — TETANUS-DIPHTH-ACELL PERTUSSIS 5-2.5-18.5 LF-MCG/0.5 IM SUSP: 0.5000 mL | Freq: Once | INTRAMUSCULAR | Status: DC

## 2016-11-24 MED ORDER — OXYCODONE-ACETAMINOPHEN 5-325 MG PO TABS: 2.0000 | ORAL_TABLET | ORAL | Status: DC | PRN

## 2016-11-24 MED ORDER — OXYTOCIN BOLUS FROM INFUSION
500.0000 mL | Freq: Once | INTRAVENOUS | Status: AC
  Administered 2016-11-24: 500 mL via INTRAVENOUS

## 2016-11-24 MED ORDER — LIDOCAINE HCL (PF) 1 % IJ SOLN
30.0000 mL | INTRAMUSCULAR | Status: DC | PRN
  Filled 2016-11-24: qty 30

## 2016-11-24 MED ORDER — PRENATAL MULTIVITAMIN CH
1.0000 | ORAL_TABLET | Freq: Every day | ORAL | Status: DC
  Filled 2016-11-24: qty 1

## 2016-11-24 MED ORDER — ONDANSETRON HCL 4 MG PO TABS: 4.0000 mg | ORAL_TABLET | ORAL | Status: DC | PRN

## 2016-11-24 MED ORDER — COCONUT OIL OIL: 1.0000 "application " | TOPICAL_OIL | Status: DC | PRN

## 2016-11-24 MED ORDER — BENZOCAINE-MENTHOL 20-0.5 % EX AERO
1.0000 "application " | INHALATION_SPRAY | CUTANEOUS | Status: DC | PRN
  Administered 2016-11-24: 1 via TOPICAL
  Filled 2016-11-24: qty 56

## 2016-11-24 MED ORDER — MAGNESIUM HYDROXIDE 400 MG/5ML PO SUSP: 30.0000 mL | ORAL | Status: DC | PRN

## 2016-11-24 MED ORDER — DIBUCAINE 1 % RE OINT: 1.0000 "application " | TOPICAL_OINTMENT | RECTAL | Status: DC | PRN

## 2016-11-24 MED ORDER — SOD CITRATE-CITRIC ACID 500-334 MG/5ML PO SOLN: 30.0000 mL | ORAL | Status: DC | PRN

## 2016-11-24 MED ORDER — ACETAMINOPHEN 325 MG PO TABS: 650.0000 mg | ORAL_TABLET | ORAL | Status: DC | PRN

## 2016-11-24 MED ORDER — HYDROXYZINE HCL 50 MG PO TABS
50.0000 mg | ORAL_TABLET | Freq: Four times a day (QID) | ORAL | Status: DC | PRN
  Filled 2016-11-24: qty 1

## 2016-11-24 MED ORDER — SIMETHICONE 80 MG PO CHEW: 80.0000 mg | CHEWABLE_TABLET | ORAL | Status: DC | PRN

## 2016-11-24 MED ORDER — IBUPROFEN 100 MG/5ML PO SUSP
800.0000 mg | Freq: Four times a day (QID) | ORAL | Status: DC | PRN
  Administered 2016-11-24 – 2016-11-25 (×2): 800 mg via ORAL
  Filled 2016-11-24 (×3): qty 40

## 2016-11-24 MED ORDER — ONDANSETRON HCL 4 MG/2ML IJ SOLN: 4.0000 mg | INTRAMUSCULAR | Status: DC | PRN

## 2016-11-24 MED ORDER — SENNOSIDES-DOCUSATE SODIUM 8.6-50 MG PO TABS
2.0000 | ORAL_TABLET | ORAL | Status: DC
  Administered 2016-11-25: 2 via ORAL
  Filled 2016-11-24: qty 2

## 2016-11-24 MED ORDER — OXYCODONE-ACETAMINOPHEN 5-325 MG PO TABS: 1.0000 | ORAL_TABLET | ORAL | Status: DC | PRN

## 2016-11-24 MED ORDER — ZOLPIDEM TARTRATE 5 MG PO TABS
5.0000 mg | ORAL_TABLET | Freq: Every evening | ORAL | Status: DC | PRN
Start: 2016-11-24 — End: 2016-11-25

## 2016-11-24 MED ORDER — BUTORPHANOL TARTRATE 1 MG/ML IJ SOLN
1.0000 mg | INTRAMUSCULAR | Status: DC | PRN
  Administered 2016-11-24 (×2): 1 mg via INTRAVENOUS
  Filled 2016-11-24 (×2): qty 1

## 2016-11-24 MED ORDER — DIPHENHYDRAMINE HCL 25 MG PO CAPS: 25.0000 mg | ORAL_CAPSULE | Freq: Four times a day (QID) | ORAL | Status: DC | PRN

## 2016-11-24 MED ORDER — LACTATED RINGERS IV SOLN: 500.0000 mL | INTRAVENOUS | Status: DC | PRN

## 2016-11-24 MED ORDER — OXYTOCIN 40 UNITS IN LACTATED RINGERS INFUSION - SIMPLE MED: 2.5000 [IU]/h | INTRAVENOUS | Status: DC | PRN

## 2016-11-24 MED ORDER — WITCH HAZEL-GLYCERIN EX PADS: 1.0000 "application " | MEDICATED_PAD | CUTANEOUS | Status: DC | PRN

## 2016-11-24 MED ORDER — LACTATED RINGERS IV SOLN
INTRAVENOUS | Status: DC
  Administered 2016-11-24: 12:00:00 via INTRAVENOUS

## 2016-11-24 MED ORDER — ONDANSETRON HCL 4 MG/2ML IJ SOLN: 4.0000 mg | Freq: Four times a day (QID) | INTRAMUSCULAR | Status: DC | PRN

## 2016-11-24 MED ORDER — OXYTOCIN 40 UNITS IN LACTATED RINGERS INFUSION - SIMPLE MED
2.5000 [IU]/h | INTRAVENOUS | Status: DC
  Filled 2016-11-24: qty 1000

## 2016-11-24 NOTE — Plan of Care (Signed)
Problem: Role Relationship: Goal: Ability to demonstrate positive interaction with newborn will improve Outcome: Completed/Met Date Met: 11/24/16 Patient is bonding well with newborn.    

## 2016-11-24 NOTE — H&P (Signed)
Alexis LittenRonnetta Robbins is a 31 y.o. female G5 P3013 presenting for contractions.  Mild contractions started overnight.  This morning contractions got stronger and every 3 minutes quickly.  Pt denies LOF, VB.  Baby is very active. PNC with Dr. Gerald Leitzara Cole has been uncomplicated. OB History    Gravida Para Term Preterm AB Living   4 3 3     3    SAB TAB Ectopic Multiple Live Births           1     Past Medical History:  Diagnosis Date  . Anemia   . Hx of chlamydia infection   . Hx of varicella   . Intrahepatic cholestasis of pregnancy, antepartum 02/10/2012   Induction of labor  . Tilted uterus    Past Surgical History:  Procedure Laterality Date  . CESAREAN SECTION     C/S x 1   Family History: family history includes Breast cancer in her maternal grandmother; Hypertension in her maternal aunt, maternal grandmother, and mother. Social History:  reports that she has never smoked. She has never used smokeless tobacco. She reports that she drinks alcohol. She reports that she does not use drugs.     Maternal Diabetes: No Genetic Screening: Normal Maternal Ultrasounds/Referrals: Normal Fetal Ultrasounds or other Referrals:  None Maternal Substance Abuse:  No Significant Maternal Medications:  None Significant Maternal Lab Results:  Lab values include: Group B Strep negative Other Comments:  None  Review of Systems  Gastrointestinal: Positive for abdominal pain.   Maternal Medical History:  Reason for admission: Contractions.   Contractions: Onset was 6-12 hours ago.   Frequency: regular.   Perceived severity is strong.    Fetal activity: Perceived fetal activity is normal.    Prenatal complications: no prenatal complications Prenatal Complications - Diabetes: none.    Dilation: 7 Effacement (%): 90 Station: 0 Exam by:: A. Millner RN Blood pressure 130/84, pulse 88, temperature 98.2 F (36.8 C), temperature source Oral, resp. rate 18, height 5\' 6"  (1.676 m), weight 93.9 kg  (207 lb), unknown if currently breastfeeding. Maternal Exam:  Uterine Assessment: Contraction strength is firm.  Contraction frequency is regular.   Abdomen: Patient reports no abdominal tenderness. Fetal presentation: vertex  Introitus: not evaluated.   Cervix: Cervix 7/90/0 per RN.  Fetal Exam Fetal Monitor Review: Mode: fetoscope.   Baseline rate: 140s.  Variability: moderate (6-25 bpm).   Pattern: no accelerations.    Fetal State Assessment: Category I - tracings are normal.     Physical Exam  Constitutional: She is oriented to person, place, and time. She appears well-developed and well-nourished.  HENT:  Head: Normocephalic and atraumatic.  Neck: Normal range of motion.  Respiratory: No respiratory distress.  GI: There is no tenderness.  Musculoskeletal: Normal range of motion. She exhibits edema.  Neurological: She is alert and oriented to person, place, and time.  Skin: Skin is warm and dry.  Psychiatric: She has a normal mood and affect.    Prenatal labs: ABO, Rh: B/Positive/-- (03/16 0000) Antibody: Negative (03/16 0000) Rubella: Immune (03/16 0000) RPR: Nonreactive (03/16 0000)  HBsAg: Negative (03/16 0000)  HIV: Non-reactive (03/16 0000)  GBS: Negative (10/03 0000)   Assessment/Plan: IUP @ 39 5/7 weeks Active labor. TOLAC.  Successful VBAC x 2.  Counsel on uterine rupture. Declines epidural. Offered nitrous oxide. Expectant management.   Alexis Robbins 11/24/2016, 1:02 PM

## 2016-11-24 NOTE — Progress Notes (Signed)
MD informed of SVE, unable to determine presentation due to bulging membrane, MD is coming to verify presentation by U/S.

## 2016-11-24 NOTE — MAU Note (Signed)
Pt C/O contractions during the night, became more consistent around 0700.  Denies bleeding or LOF.

## 2016-11-24 NOTE — Progress Notes (Signed)
Alexis Robbins is a 31 y.o. G4P3003 at 6847w5d  Subjective:   Objective: BP 130/84   Pulse 88   Temp 98.2 F (36.8 C) (Oral)   Resp 18   Ht 5\' 6"  (1.676 m)   Wt 93.9 kg (207 lb)   BMI 33.41 kg/m  No intake/output data recorded. No intake/output data recorded.  FHT:  FHR: 140s bpm, variability: moderate,  accelerations:  Present,  decelerations:  Absent UC:   regular, every 5 minutes SVE:   Dilation: 4.5 Effacement (%): 90 Station: -1 Exam by:: Dr. Dion BodyVarnado  Bulging bag   AROM thick meconium  Labs: Lab Results  Component Value Date   WBC 9.2 11/24/2016   HGB 10.8 (L) 11/24/2016   HCT 32.8 (L) 11/24/2016   MCV 89.1 11/24/2016   PLT 181 11/24/2016    Assessment / Plan: IUP @ 39 5/7 weeks  TOLAC-Consent signed.  No signes of uterine rupture.  Labor: S/p AROM to augment labor. Preeclampsia:  BPs elevated.  Awaiting labs. Fetal Wellbeing:  Category I Pain Control:  Labor support without medications and Nitrous Oxide I/D:  n/a Anticipated MOD:  NSVD  Alexis Robbins 11/24/2016, 1:33 PM

## 2016-11-24 NOTE — Anesthesia Pain Management Evaluation Note (Signed)
  CRNA Pain Management Visit Note  Patient: Alexis Robbins, 31 y.o., female  "Hello I am a [facility] of the anesthesia team at [facility]. We have an anesthesia team available at all times to provide care throughout the hospital, including epidural management and anesthesia for C-section. I don't know your plan for the delivery whether it a natural birth, water birth, IV sedation, nitrous supplementation, doula or epidural, but we want to meet your pain goals."   1.Was your pain managed to your expectations on prior hospitalizations?   Yes   2.What is your expectation for pain management during this hospitalization?     IV pain meds and Nitrous Oxide  3.How can we help you reach that goal? Contact RN  Record the patient's initial score and the patient's pain goal.   Pain: 8  Pain Goal: 6 [facility] wants you to be able to say your pain was always managed very well.  Alexis Robbins 11/24/2016

## 2016-11-25 LAB — CBC
HEMATOCRIT: 30.4 % — AB (ref 36.0–46.0)
HEMOGLOBIN: 10.3 g/dL — AB (ref 12.0–15.0)
MCH: 30.1 pg (ref 26.0–34.0)
MCHC: 33.9 g/dL (ref 30.0–36.0)
MCV: 88.9 fL (ref 78.0–100.0)
Platelets: 173 10*3/uL (ref 150–400)
RBC: 3.42 MIL/uL — ABNORMAL LOW (ref 3.87–5.11)
RDW: 15.9 % — AB (ref 11.5–15.5)
WBC: 14.5 10*3/uL — ABNORMAL HIGH (ref 4.0–10.5)

## 2016-11-25 LAB — RPR: RPR Ser Ql: NONREACTIVE

## 2016-11-25 MED ORDER — IBUPROFEN 100 MG/5ML PO SUSP: 600.0000 mg | Freq: Four times a day (QID) | ORAL | 1 refills | Status: DC | PRN

## 2016-11-25 NOTE — Progress Notes (Signed)
Post Partum Day 1 Subjective: no complaints, up ad lib, voiding, tolerating PO and + flatus .Marland Kitchen. Pt desires early discharge home   Objective: Blood pressure 130/83, pulse 82, temperature 98.2 F (36.8 C), temperature source Oral, resp. rate 20, height 5\' 6"  (1.676 m), weight 93.9 kg (207 lb), SpO2 100 %, unknown if currently breastfeeding.  Physical Exam:  General: alert, cooperative and no distress Lochia: appropriate Uterine Fundus: firm Incision: NA DVT Evaluation: No evidence of DVT seen on physical exam.   Recent Labs  11/24/16 1215 11/25/16 0531  HGB 10.8* 10.3*  HCT 32.8* 30.4*    Assessment/Plan: Discharge home and Breastfeeding  Patient denies any history of postpartum depression  Follow up in the office in 6 wks for PPV    LOS: 1 day   Chaya Dehaan J. 11/25/2016, 4:58 PM

## 2016-11-25 NOTE — Lactation Note (Signed)
This note was copied from a baby's chart. Lactation Consultation Note Mom's 4th baby. BF average 6 weeks mom stated. Mom has long tubular soft breast. Hand expressed easy flow of colostrum. Assisted in football position. Mom stated much better. Moms breast are soft.  Reviewed newborn feeding habits. Mom has 32 yr old, 476 yrs old, 654 yr old.  Mom encouraged to feed baby 8-12 times/24 hours and with feeding cues. Mom encouraged to feed baby 8-12 times/24 hours and with feeding cues. Encouraged STS, I&O cluster feeding. WH/LC brochure given w/resources, support groups and LC services. Patient Name: Alexis Harless LittenRonnetta Aboud [id number]'s Date: 11/25/2016 Reason for consult: Initial assessment   Maternal Data Has patient been taught Hand Expression?: Yes Does the patient have breastfeeding experience prior to this delivery?: Yes  Feeding Feeding Type: Breast Fed Length of feed: 10 min (still BF)  LATCH Score Latch: Repeated attempts needed to sustain latch, nipple held in mouth throughout feeding, stimulation needed to elicit sucking reflex.  Audible Swallowing: A few with stimulation  Type of Nipple: Flat  Comfort (Breast/Nipple): Soft / non-tender  Hold (Positioning): Assistance needed to correctly position infant at breast and maintain latch.  LATCH Score: 6  Interventions Interventions: Breast feeding basics reviewed;Breast compression;Assisted with latch;Adjust position;Skin to skin;Support pillows;Breast massage;Position options;Hand express  Lactation Tools Discussed/Used WIC Program: Yes   Consult Status Consult Status: Follow-up Date: 11/26/16 Follow-up type: In-patient    Charyl DancerCARVER, Travis Mastel G 11/25/2016, 4:35 AM

## 2016-11-25 NOTE — Lactation Note (Signed)
This note was copied from a baby's chart. Lactation Consultation Note  Patient Name: Alexis Robbins LittenRonnetta Gullett ZOXWR'UToday's Date: 11/25/2016 Reason for consult: Follow-up assessment   Follow up with mom of 22 hour old infant. Infant with 11 BF for 10-60 minutes, 2 voids and 2 stool since birth. LATCH scores 6-7.   Mom is concerned infant is feeding so often, discussed feeding behaviors of the NB. Showed mom hand expression and she was able to hand express 2 cc colostrum. Colostrum easily expressible from both breasts. Showed mom how to spoon feed infant. Infant tolerated it well. Mom then asked if we would help her latch infant while LC in room. Assisted mom in latching infant in the cross cradle hold to the right breast. Mom with large pendulous breasts, showed mom how to place rolled up infant blanket under breast to assist with breast support. Mom is concerned of how she will be able to feed an infant with 3 other children at home, enc mom to focus on now and try to exclusively BF infant at this time.   Mom was given manual pump, shown how to assemble, disassemble, and clean pump parts. Mom to use pump to obtain colostrum to spoon feed to infant. Mom appreciative of assistance. To call out for further assistance as needed.    Maternal Data Formula Feeding for Exclusion: No Has patient been taught Hand Expression?: Yes Does the patient have breastfeeding experience prior to this delivery?: Yes  Feeding Feeding Type: Breast Fed Length of feed: 15 min  LATCH Score Latch: Grasps breast easily, tongue down, lips flanged, rhythmical sucking.  Audible Swallowing: Spontaneous and intermittent  Type of Nipple: Everted at rest and after stimulation  Comfort (Breast/Nipple): Soft / non-tender  Hold (Positioning): Assistance needed to correctly position infant at breast and maintain latch.  LATCH Score: 9  Interventions Interventions: Breast feeding basics reviewed;Support pillows;Assisted with  latch;Position options;Skin to skin;Expressed milk;Breast massage;Breast compression  Lactation Tools Discussed/Used Tools: Pump Breast pump type: Manual WIC Program: Yes Pump Review: Setup, frequency, and cleaning;Milk Storage Initiated by:: Noralee StainSharon Sheyanne Munley, RN, IBCLC   Consult Status Consult Status: Follow-up Date: 11/26/16 Follow-up type: In-patient    Silas FloodSharon S Chauntel [place] 11/25/2016, 4:07 PM

## 2016-11-25 NOTE — Discharge Summary (Signed)
OB Discharge Summary     Patient Name: Alexis Robbins DOB: [birth year] MRN: [id number]  Date of admission: 11/24/2016 Delivering MD: Geryl RankinsVARNADO, EVELYN   Date of discharge: 11/25/2016  Admitting diagnosis: LABOR Intrauterine pregnancy: 2836w5d     Secondary diagnosis:  Active Problems:   Normal labor  Additional problems: None     Discharge diagnosis: Term Pregnancy Delivered                                                                                                Post partum procedures:None  Augmentation: NOne  Complications: None  Hospital course:  Onset of Labor With Vaginal Delivery     31 y.o. yo [id number] at 4136w5d was admitted in Active Labor on 11/24/2016. Patient had an uncomplicated labor course as follows:  Membrane Rupture Time/Date: 1:27 PM ,11/24/2016   Intrapartum Procedures: Episiotomy: None [1]                                         Lacerations:  Labial [10]  Patient had a delivery of a Viable infant. 11/24/2016  Information for the patient's newborn:  Lorenza CambridgeBurton, Boy Nairi [[id number]][[id number]]  Delivery Method: Vag-Spont    Pateint had an uncomplicated postpartum course.  She is ambulating, tolerating a regular diet, passing flatus, and urinating well. Patient is discharged home in stable condition on 11/25/16.   Physical exam  Vitals:   11/24/16 2010 11/25/16 0007 11/25/16 0500 11/25/16 0700  BP: 137/70 135/73 136/73 130/83  Pulse: 84 74 76 82  Resp: 18 18 18 20   Temp: 98.8 F (37.1 C) 99 F (37.2 C) 98.3 F (36.8 C) 98.2 F (36.8 C)  TempSrc:   Oral Oral  SpO2: 99% 99% 100%   Weight:      Height:       General: alert, cooperative and no distress Lochia: appropriate Uterine Fundus: firm Incision: N/A DVT Evaluation: No evidence of DVT seen on physical exam. Labs: Lab Results  Component Value Date   WBC 14.5 (H) 11/25/2016   HGB 10.3 (L) 11/25/2016   HCT 30.4 (L) 11/25/2016   MCV 88.9 11/25/2016   PLT 173 11/25/2016   CMP Latest Ref Rng &  Units 11/24/2016  Glucose 65 - 99 mg/dL 78  BUN 6 - 20 mg/dL 8  Creatinine 0.270.44 - 2.531.00 mg/dL 6.640.52  Sodium 403135 - 474145 mmol/L 136  Potassium 3.5 - 5.1 mmol/L 4.1  Chloride 101 - 111 mmol/L 107  CO2 22 - 32 mmol/L 20(L)  Calcium 8.9 - 10.3 mg/dL 2.5(Z8.6(L)  Total Protein 6.5 - 8.1 g/dL 6.8  Total Bilirubin 0.3 - 1.2 mg/dL 0.4  Alkaline Phos 38 - 126 U/L 302(H)  AST 15 - 41 U/L 21  ALT 14 - 54 U/L 11(L)    Discharge instruction: per After Visit Summary and "Baby and Me Booklet".  After visit meds:  Allergies as of 11/25/2016   No Known Allergies     Medication List    STOP taking these medications  FERROUS SULFATE PO     TAKE these medications   hydrOXYzine 25 MG tablet Commonly known as:  ATARAX/VISTARIL Take 1 tablet (25 mg total) by mouth every 8 (eight) hours as needed for itching.   ibuprofen 100 MG/5ML suspension Commonly known as:  ADVIL,MOTRIN Take 30 mLs (600 mg total) by mouth every 6 (six) hours as needed for mild pain or moderate pain.   prenatal multivitamin Tabs tablet Take 1 tablet by mouth every morning.       Diet: routine diet  Activity: Advance as tolerated. Pelvic rest for 6 weeks.   Outpatient follow up:6 weeks Follow up Appt:No future appointments. Follow up Visit:No Follow-up on file.  Postpartum contraception: Not Discussed  Newborn Data: Live born female  Birth Weight: 7 lb 10.6 oz (3476 g) APGAR: 8, 9  Newborn Delivery   Birth date/time:  11/24/2016 17:33:00 Delivery type:  VBAC, Spontaneous      Baby Feeding: Breast Disposition:home with mother   11/25/2016 Jessee Avers., MD

## 2016-11-26 ENCOUNTER — Inpatient Hospital Stay (HOSPITAL_COMMUNITY): Admission: RE | Admit: 2016-11-26 | Payer: BC Managed Care – PPO | Source: Ambulatory Visit

## 2017-04-14 NOTE — Patient Instructions (Addendum)
Your procedure is scheduled on:  Wednesday, March 27  [facility] through [facility] at: 12:30 pm  Pick up the phone at the desk and dial [phone].  Call this number if you have problems the morning of surgery: [phone].  Remember: Do NOT eat food after midnight Tuesday  Do NOT drink clear liquids (including water) after 8 am Wednesday  Take these medicines the morning of surgery with a SIP OF WATER:  None.  Stop herbal medications and supplements at this time.  Do NOT wear jewelry (body piercing), metal hair clips/bobby pins, make-up, or nail polish. Do NOT wear lotions, powders, or perfumes.  You may wear deoderant. Do NOT shave for 48 hours prior to surgery. Do NOT bring valuables to the hospital.  Have a responsible adult drive you home and stay with you for 24 hours after your procedure.  Home with Boyfriend Gardiner Barefootathaniel cell [phone].

## 2017-04-19 ENCOUNTER — Encounter (HOSPITAL_COMMUNITY): Payer: Self-pay

## 2017-04-19 ENCOUNTER — Encounter (HOSPITAL_COMMUNITY)
Admission: RE | Admit: 2017-04-19 | Discharge: 2017-04-19 | Disposition: A | Discharge status: Home / Self Care | Payer: BC Managed Care – PPO | Source: Ambulatory Visit | Attending: Obstetrics and Gynecology | Admitting: Obstetrics and Gynecology

## 2017-04-19 ENCOUNTER — Other Ambulatory Visit: Payer: Self-pay

## 2017-04-19 ENCOUNTER — Other Ambulatory Visit: Payer: Self-pay | Admitting: Obstetrics and Gynecology

## 2017-04-19 DIAGNOSIS — Z01812 Encounter for preprocedural laboratory examination: Secondary | ICD-10-CM | POA: Diagnosis present

## 2017-04-19 LAB — CBC
HEMATOCRIT: 36.1 % (ref 36.0–46.0)
HEMOGLOBIN: 12 g/dL (ref 12.0–15.0)
MCH: 29.6 pg (ref 26.0–34.0)
MCHC: 33.2 g/dL (ref 30.0–36.0)
MCV: 89.1 fL (ref 78.0–100.0)
Platelets: 289 10*3/uL (ref 150–400)
RBC: 4.05 MIL/uL (ref 3.87–5.11)
RDW: 14.1 % (ref 11.5–15.5)
WBC: 8.7 10*3/uL (ref 4.0–10.5)

## 2017-04-25 ENCOUNTER — Other Ambulatory Visit: Payer: Self-pay | Admitting: Obstetrics and Gynecology

## 2017-04-25 NOTE — H&P (Deleted)
  The note originally documented on this encounter has been moved the the encounter in which it belongs.  

## 2017-04-25 NOTE — H&P (Signed)
Reason for Appointment  1. pre-op for BTL/04/26/17   History of Present Illness  General:  32 y/o presents for preoperative visit in preparation for Lap btl on 04/26/2017. she desires permanent sterilization. she is without complaints today.   Current Medications  Not-Taking   Ortho Micronor(Norethindrone) 0.35 MG Tablet as directed Orally 1 daily   Metronidazole 500 mg 7 d 500 mg tablet one tablet orally twice a day   Clindamycin 300 mg 7d 300 mg tablets one tab orally twice a day   Diflucan(Fluconazole) 150 MG Tablet 1 tablet Orally   Medication List reviewed and reconciled with the patient    Past Medical History  Medical History Verified..    Surgical History  C-section 2008   Family History  Mother: alive, diagnosed with Hypertension  Maternal Grand Father: deceased  Maternal Grand Mother: alive, Breast cancer  Maternal aunt: alive, Hypertension  1 son(s) , 2 daughter(s) .   Maternal half-aunt with Breast Cancer.   Social History  General:  Tobacco use cigarettes: Never smoked, Tobacco history last updated 04/11/2017. no EXPOSURE TO PASSIVE SMOKE. no Alcohol, quit once she found out she was pregnant . no Recreational drug use. Marital Status: single. Children: Boys, 1, 2, daughter (s). OCCUPATION: [employer].    Gyn History  Sexual activity currently sexually active.  Periods : every 28 days.  LMP 04/05/2015.  Birth control none.  Last pap smear date 04/15/16 - WNL negative HPV.  Denies H/O Last mammogram date.  Denies H/O Abnormal pap smear.  H/O STD Chlamydia, Gonorrhea.    OB History  Pregnancy # 1 live birth, C-section delivery, girl.  Pregnancy # 2 live birth, vaginal delivery, girl.  Pregnancy # 3 live birth, vaginal delivery, boy.  Pregnancy # 4: abortion.  Pregnancy # 5: Live Birth, vaginal delivery, Boy Pamelia Hoit( Levi).    Allergies  N.K.D.A.   Hospitalization/Major Diagnostic Procedure  childbirth    Review of Systems  CONSTITUTIONAL:  Chills  No. Fatigue No. Fever No. Night sweats No. Recent travel outside [place] No. Sweats No. Weight change No.  OPHTHALMOLOGY:  Blurring of vision no. Change in vision no. Double vision no.  ENT:  Dizziness no. Nose bleeds no. Sore throat no. Teeth pain no.  ALLERGY:  Hives no.  CARDIOLOGY:  Chest pain no. High blood pressure no. Irregular heart beat no. Leg edema no. Palpitations no.  RESPIRATORY:  Shortness of breath no. Cough no. Wheezing no.  UROLOGY:  Pain with urination no. Urinary urgency no. Urinary frequency no. Urinary incontinence no. Difficulty urinating No. Blood in urine No.  GASTROENTEROLOGY:  Abdominal pain no. Appetite change no. Bloating/belching no. Blood in stool or on toilet paper no. Change in bowel movements no. Constipation no. Diarrhea no. Difficulty swallowing no. Nausea no.  FEMALE REPRODUCTIVE:  Vulvar pain no. Vulvar rash no. Abnormal vaginal bleeding no. Breast pain no. Nipple discharge no. Pain with intercourse no. Pelvic pain no. Unusual vaginal discharge no. Vaginal itching no.  MUSCULOSKELETAL:  Muscle aches no.  NEUROLOGY:  Headache no. Tingling/numbness no. Weakness no.  PSYCHOLOGY:  Depression no. Anxiety no. Nervousness no. Sleep disturbances no. Suicidal ideation no .  ENDOCRINOLOGY:  Excessive thirst no. Excessive urination no. Hair loss no. Heat or cold intolerance no.  HEMATOLOGY/LYMPH:  Abnormal bleeding no. Easy bruising no. Swollen glands no.  DERMATOLOGY:  New/changing skin lesion no. Rash no. Sores no.  Negative except as stated in HPI.     Vital Signs  Wt 207, Wt change  34 lb, Ht 66, BMI 33.41, Pulse sitting 98, BP sitting 100/88.   Examination  General Examination: GENERAL APPEARANCE alert, oriented, NAD . SKIN: moist, warm. EYES: Conjunctiva clear. LUNGS: clear to auscultation bilaterally. HEART: regular rate and rhythm. ABDOMEN: soft, non-tender/non-distended, bowel sounds present . FEMALE GENITOURINARY: normal external genitalia, labia -  unremarkable, vagina - pink moist mucosa, no lesions or abnormal discharge, cervix - no discharge or lesions or CMT, adnexa - no masses or tenderness, uterus - nontender and normal size on palpation . PSYCH: affect normal, good eye contact.     Assessments   1. Encounter for sterilization - Z30.2 (Primary)   Treatment  1. Encounter for sterilization  Notes: pt desires laparoscopic bilateral tubal ligation. R/B/A of surgery discussed with the patient including but not limited to infection/ bleeding damage to bowel bladder with the need for further surgery. 1 % r/o failure discussed with 50 % r/o ectopic pregnancy if failure occurs .. pt voiced understanding and desires to proceed.Marland Kitchen

## 2017-04-26 ENCOUNTER — Ambulatory Visit (HOSPITAL_COMMUNITY)
Admission: AD | Admit: 2017-04-26 | Discharge: 2017-04-26 | Disposition: A | Discharge status: Home / Self Care | Payer: BC Managed Care – PPO | Source: Ambulatory Visit | Attending: Obstetrics and Gynecology | Admitting: Obstetrics and Gynecology

## 2017-04-26 ENCOUNTER — Encounter (HOSPITAL_COMMUNITY)
Admission: AD | Disposition: A | Discharge status: Home / Self Care | Payer: Self-pay | Source: Ambulatory Visit | Attending: Obstetrics and Gynecology

## 2017-04-26 ENCOUNTER — Ambulatory Visit (HOSPITAL_COMMUNITY): Payer: BC Managed Care – PPO | Admitting: Certified Registered Nurse Anesthetist

## 2017-04-26 ENCOUNTER — Encounter (HOSPITAL_COMMUNITY): Payer: Self-pay

## 2017-04-26 ENCOUNTER — Other Ambulatory Visit: Payer: Self-pay

## 2017-04-26 DIAGNOSIS — Z302 Encounter for sterilization: Secondary | ICD-10-CM | POA: Insufficient documentation

## 2017-04-26 HISTORY — PX: LAPAROSCOPIC TUBAL LIGATION: SHX1937

## 2017-04-26 LAB — PREGNANCY, URINE: PREG TEST UR: NEGATIVE

## 2017-04-26 SURGERY — LIGATION, FALLOPIAN TUBE, LAPAROSCOPIC
Anesthesia: General | Site: Abdomen | Laterality: Bilateral

## 2017-04-26 MED ORDER — LIDOCAINE HCL (CARDIAC) 20 MG/ML IV SOLN
INTRAVENOUS | Status: DC | PRN
  Administered 2017-04-26: 50 mg via INTRAVENOUS

## 2017-04-26 MED ORDER — SUGAMMADEX SODIUM 200 MG/2ML IV SOLN
INTRAVENOUS | Status: AC
  Filled 2017-04-26: qty 2

## 2017-04-26 MED ORDER — LACTATED RINGERS IV SOLN
INTRAVENOUS | Status: DC
  Administered 2017-04-26 (×2): via INTRAVENOUS

## 2017-04-26 MED ORDER — ROCURONIUM BROMIDE 100 MG/10ML IV SOLN
INTRAVENOUS | Status: AC
  Filled 2017-04-26: qty 1

## 2017-04-26 MED ORDER — OXYCODONE HCL 5 MG/5ML PO SOLN: 5.0000 mg | Freq: Once | ORAL | Status: DC | PRN

## 2017-04-26 MED ORDER — BUPIVACAINE HCL (PF) 0.25 % IJ SOLN
INTRAMUSCULAR | Status: AC
  Filled 2017-04-26: qty 30

## 2017-04-26 MED ORDER — BUPIVACAINE HCL (PF) 0.25 % IJ SOLN
INTRAMUSCULAR | Status: DC | PRN
  Administered 2017-04-26: 20 mL
  Administered 2017-04-26: 10 mL

## 2017-04-26 MED ORDER — ROCURONIUM BROMIDE 100 MG/10ML IV SOLN
INTRAVENOUS | Status: DC | PRN
  Administered 2017-04-26: 50 mg via INTRAVENOUS

## 2017-04-26 MED ORDER — ONDANSETRON HCL 4 MG/2ML IJ SOLN
INTRAMUSCULAR | Status: DC | PRN
  Administered 2017-04-26: 4 mg via INTRAVENOUS

## 2017-04-26 MED ORDER — DEXAMETHASONE SODIUM PHOSPHATE 10 MG/ML IJ SOLN
INTRAMUSCULAR | Status: DC | PRN
  Administered 2017-04-26: 4 mg via INTRAVENOUS

## 2017-04-26 MED ORDER — KETOROLAC TROMETHAMINE 30 MG/ML IJ SOLN: 30.0000 mg | Freq: Once | INTRAMUSCULAR | Status: DC | PRN

## 2017-04-26 MED ORDER — SCOPOLAMINE 1 MG/3DAYS TD PT72: 1.0000 | MEDICATED_PATCH | Freq: Once | TRANSDERMAL | Status: DC

## 2017-04-26 MED ORDER — MEPERIDINE HCL 25 MG/ML IJ SOLN: 6.2500 mg | INTRAMUSCULAR | Status: DC | PRN

## 2017-04-26 MED ORDER — SUGAMMADEX SODIUM 200 MG/2ML IV SOLN
INTRAVENOUS | Status: DC | PRN
  Administered 2017-04-26: 200 mg via INTRAVENOUS

## 2017-04-26 MED ORDER — SUGAMMADEX SODIUM 500 MG/5ML IV SOLN
INTRAVENOUS | Status: AC
  Filled 2017-04-26: qty 5

## 2017-04-26 MED ORDER — HYDROCODONE-ACETAMINOPHEN 5-325 MG PO TABS: 1.0000 | ORAL_TABLET | Freq: Four times a day (QID) | ORAL | 0 refills | Status: AC | PRN

## 2017-04-26 MED ORDER — OXYCODONE HCL 5 MG PO TABS: 5.0000 mg | ORAL_TABLET | Freq: Once | ORAL | Status: DC | PRN

## 2017-04-26 MED ORDER — FENTANYL CITRATE (PF) 250 MCG/5ML IJ SOLN
INTRAMUSCULAR | Status: AC
  Filled 2017-04-26: qty 5

## 2017-04-26 MED ORDER — ONDANSETRON HCL 4 MG/2ML IJ SOLN
INTRAMUSCULAR | Status: AC
  Filled 2017-04-26: qty 2

## 2017-04-26 MED ORDER — DEXAMETHASONE SODIUM PHOSPHATE 4 MG/ML IJ SOLN
INTRAMUSCULAR | Status: AC
  Filled 2017-04-26: qty 1

## 2017-04-26 MED ORDER — PROPOFOL 10 MG/ML IV BOLUS
INTRAVENOUS | Status: DC | PRN
  Administered 2017-04-26: 200 mg via INTRAVENOUS

## 2017-04-26 MED ORDER — PROPOFOL 10 MG/ML IV BOLUS
INTRAVENOUS | Status: AC
  Filled 2017-04-26: qty 20

## 2017-04-26 MED ORDER — KETOROLAC TROMETHAMINE 30 MG/ML IJ SOLN
INTRAMUSCULAR | Status: AC
  Filled 2017-04-26: qty 1

## 2017-04-26 MED ORDER — HYDROMORPHONE HCL 1 MG/ML IJ SOLN: 0.2500 mg | INTRAMUSCULAR | Status: DC | PRN

## 2017-04-26 MED ORDER — KETOROLAC TROMETHAMINE 30 MG/ML IJ SOLN
INTRAMUSCULAR | Status: DC | PRN
  Administered 2017-04-26: 30 mg via INTRAVENOUS

## 2017-04-26 MED ORDER — MIDAZOLAM HCL 2 MG/2ML IJ SOLN
INTRAMUSCULAR | Status: AC
  Filled 2017-04-26: qty 2

## 2017-04-26 MED ORDER — PROMETHAZINE HCL 25 MG/ML IJ SOLN: 6.2500 mg | INTRAMUSCULAR | Status: DC | PRN

## 2017-04-26 MED ORDER — MIDAZOLAM HCL 2 MG/2ML IJ SOLN
INTRAMUSCULAR | Status: DC | PRN
  Administered 2017-04-26: 2 mg via INTRAVENOUS

## 2017-04-26 MED ORDER — SCOPOLAMINE 1 MG/3DAYS TD PT72
MEDICATED_PATCH | TRANSDERMAL | Status: AC
  Filled 2017-04-26: qty 1

## 2017-04-26 MED ORDER — FENTANYL CITRATE (PF) 100 MCG/2ML IJ SOLN
INTRAMUSCULAR | Status: DC | PRN
  Administered 2017-04-26 (×4): 50 ug via INTRAVENOUS

## 2017-04-26 MED ORDER — IBUPROFEN 600 MG PO TABS: 600.0000 mg | ORAL_TABLET | Freq: Four times a day (QID) | ORAL | 0 refills | Status: AC | PRN

## 2017-04-26 SURGICAL SUPPLY — 22 items
CATH ROBINSON RED A/P 16FR (CATHETERS) ×3 IMPLANT
DERMABOND ADVANCED (GAUZE/BANDAGES/DRESSINGS) ×2
DERMABOND ADVANCED .7 DNX12 (GAUZE/BANDAGES/DRESSINGS) ×1 IMPLANT
DILATOR CANAL MILEX (MISCELLANEOUS) IMPLANT
DRSG OPSITE POSTOP 3X4 (GAUZE/BANDAGES/DRESSINGS) ×3 IMPLANT
DURAPREP 26ML APPLICATOR (WOUND CARE) ×3 IMPLANT
GLOVE BIOGEL M 6.5 STRL (GLOVE) ×3 IMPLANT
GLOVE BIOGEL PI IND STRL 6.5 (GLOVE) ×2 IMPLANT
GLOVE BIOGEL PI IND STRL 7.0 (GLOVE) ×2 IMPLANT
GLOVE BIOGEL PI INDICATOR 6.5 (GLOVE) ×4
GLOVE BIOGEL PI INDICATOR 7.0 (GLOVE) ×4
GOWN STRL REUS W/TWL LRG LVL3 (GOWN DISPOSABLE) ×6 IMPLANT
PACK LAPAROSCOPY BASIN (CUSTOM PROCEDURE TRAY) ×3 IMPLANT
PACK TRENDGUARD 450 HYBRID PRO (MISCELLANEOUS) ×1 IMPLANT
PROTECTOR NERVE ULNAR (MISCELLANEOUS) ×6 IMPLANT
SOLUTION ELECTROLUBE (MISCELLANEOUS) ×3 IMPLANT
SUT VICRYL 0 UR6 27IN ABS (SUTURE) ×3 IMPLANT
SUT VICRYL 4-0 PS2 18IN ABS (SUTURE) ×3 IMPLANT
TOWEL OR 17X24 6PK STRL BLUE (TOWEL DISPOSABLE) ×6 IMPLANT
TRENDGUARD 450 HYBRID PRO PACK (MISCELLANEOUS) ×3
TROCAR XCEL NON-BLD 11X100MML (ENDOMECHANICALS) ×3 IMPLANT
WARMER LAPAROSCOPE (MISCELLANEOUS) ×3 IMPLANT

## 2017-04-26 NOTE — Op Note (Signed)
04/26/2017  3:27 PM  PATIENT:  Alexis Robbins  32 y.o. female  PRE-OPERATIVE DIAGNOSIS:  Z30.011  POST-OPERATIVE DIAGNOSIS:  desires sterilization  PROCEDURE:  Procedure(s): LAPAROSCOPIC TUBAL LIGATION (Bilateral)  SURGEON:  Surgeon(s) and Role:    Gerald Leitz* Kymani Laursen, MD - Primary  PHYSICIAN ASSISTANT:   ASSISTANTS: none   ANESTHESIA:   general  EBL:  5 mL   BLOOD ADMINISTERED:none  DRAINS: none   LOCAL MEDICATIONS USED:  MARCAINE     SPECIMEN:  No Specimen  DISPOSITION OF SPECIMEN:  N/A  COUNTS:  YES  TOURNIQUET:  * No tourniquets in log *  DICTATION: .Dragon Dictation  PLAN OF CARE: Discharge to home after PACU  PATIENT DISPOSITION:  PACU - hemodynamically stable.   Delay start of Pharmacological VTE agent (>24hrs) due to surgical blood loss or risk of bleeding: not applicable  Finding normal uterus . Normal appearing fallopian tubes and ovaries   Procedure: The patient was taken to the operating room where she was placed under general anesthesia. She is placed in the dorsolithotomy position. She was prepped and draped in the usual sterile fashion. Speculum was placed into the vaginal vault. In the anterior lip of the cervix grasped with a single-tooth tenaculum. A uterine manipulator was placed without difficulty. The single-tooth tenaculum was  Removed. The speculum was removed.  Attention was turned to the umbilicus which was injected with 10 cc of quarter percent Marcaine. A 11 mm incision was made with the scalpel. A 11 mm trocar was placed under direct visualization. Entry into the peritoneum was visualized. The pneumoperitoneum was achieved with CO2 gas.  Her  fallopian tubes and ovaries appeared normal.  Operative scope was inserted and the right fallopian tube was identified and followed out to the fimbriated end. The a 2-3 cm portion of the right fallopian tube was cauterized with the Kleppinger .  This was repeated on the left fallopian tube The Kleppinger   was removed.Laparopsopic scissors were used to cut the cauterized portion of the tube  A laparoscopic needle was inserted and 10 cc of quarter percent Marcaine was placed along the ampullary portion of the tubes  bilaterally. Pneumoperitoneum was released. The umbilical trocar was removed under direct visualization. The fascia was closed with 0 Vicryl. The skin was closed with 4-0 Vicryl. The dermabond placed a over the skin incision.  Patient was awakened from anesthesia taken to the recovery room awake and in stable condition.

## 2017-04-26 NOTE — Anesthesia Procedure Notes (Signed)
Procedure Name: Intubation Date/Time: 04/26/2017 2:36 PM Performed by: Cleda ClarksBrowder, Sion Thane R, CRNA Pre-anesthesia Checklist: Patient identified, Emergency Drugs available, Suction available and Patient being monitored Patient Re-evaluated:Patient Re-evaluated prior to induction Oxygen Delivery Method: Circle system utilized Preoxygenation: Pre-oxygenation with 100% oxygen Induction Type: IV induction Ventilation: Mask ventilation without difficulty Laryngoscope Size: Miller and 3 Grade View: Grade II Tube type: Oral Tube size: 7.0 mm Number of attempts: 1 Airway Equipment and Method: Stylet Placement Confirmation: ETT inserted through vocal cords under direct vision,  positive ETCO2 and breath sounds checked- equal and bilateral Secured at: 22 cm Tube secured with: Tape Dental Injury: Teeth and Oropharynx as per pre-operative assessment

## 2017-04-26 NOTE — Discharge Instructions (Addendum)
DISCHARGE INSTRUCTIONS: Laparoscopy  Wound care:  Do not get the incision wet for the first 24 hours. The incision should be kept clean and dry.  The Band-Aids or dressings may be removed the day after surgery.  Should the incision become sore, red, and swollen after the first week, check with your doctor.  Personal hygiene:  Shower the day after your procedure.  Activity and limitations:  Do NOT drive or operate any equipment today.  Do NOT lift anything more than 15 pounds for 2-3 weeks after surgery.  Do NOT rest in bed all day.  Walking is encouraged. Walk each day, starting slowly with 5-minute walks 3 or 4 times a day. Slowly increase the length of your walks.  Walk up and down stairs slowly.  Do NOT do strenuous activities, such as golfing, playing tennis, bowling, running, biking, weight lifting, gardening, mowing, or vacuuming for 2-4 weeks. Ask your doctor when it is okay to start.  Return to work: This is dependent on the type of work you do. For the most part you can return to a desk job within a week of surgery. If you are more active at work, please discuss this with your doctor.  What to expect after your surgery: You may have a slight burning sensation when you urinate on the first day. You may have a very small amount of blood in the urine. Expect to have a small amount of vaginal discharge/light bleeding for 1-2 weeks. It is not unusual to have abdominal soreness and bruising for up to 2 weeks. You may be tired and need more rest for about 1 week. You may experience shoulder pain for 24-72 hours. Lying flat in bed may relieve it.

## 2017-04-26 NOTE — Anesthesia Preprocedure Evaluation (Signed)
Anesthesia Evaluation  Patient identified by MRN, date of birth, ID band Patient awake    Reviewed: Allergy & Precautions, H&P , NPO status , Patient's Chart, lab work & pertinent test results  Airway Mallampati: III  TM Distance: >3 FB Neck ROM: full    Dental no notable dental hx. (+) Teeth Intact   Pulmonary neg pulmonary ROS,    Pulmonary exam normal breath sounds clear to auscultation       Cardiovascular negative cardio ROS   Rhythm:regular Rate:Normal     Neuro/Psych negative neurological ROS  negative psych ROS   GI/Hepatic negative GI ROS, Neg liver ROS, Cholestasis of pregnancy   Endo/Other  negative endocrine ROS  Renal/GU negative Renal ROS  negative genitourinary   Musculoskeletal   Abdominal Normal abdominal exam  (+)   Peds  Hematology negative hematology ROS (+) anemia ,   Anesthesia Other Findings   Reproductive/Obstetrics negative OB ROS                             Anesthesia Physical  Anesthesia Plan  ASA: II  Anesthesia Plan: General   Post-op Pain Management:    Induction: Intravenous  PONV Risk Score and Plan: 4 or greater and Ondansetron, Dexamethasone, Midazolam and Scopolamine patch - Pre-op  Airway Management Planned: Oral ETT  Additional Equipment:   Intra-op Plan:   Post-operative Plan: Extubation in OR  Informed Consent: I have reviewed the patients History and Physical, chart, labs and discussed the procedure including the risks, benefits and alternatives for the proposed anesthesia with the patient or authorized representative who has indicated his/her understanding and acceptance.   Dental advisory given  Plan Discussed with: CRNA  Anesthesia Plan Comments:        Anesthesia Quick Evaluation

## 2017-04-26 NOTE — H&P (Signed)
Date of Initial H&P: 04/25/2017 History reviewed, patient examined, no change in status, stable for surgery. 

## 2017-04-26 NOTE — Transfer of Care (Signed)
Immediate Anesthesia Transfer of Care Note  Patient: Alexis LittenRonnetta Robbins  Procedure(s) Performed: LAPAROSCOPIC TUBAL LIGATION (Bilateral Abdomen)  Patient Location: PACU  Anesthesia Type:General  Level of Consciousness: sedated  Airway & Oxygen Therapy: Patient Spontanous Breathing and Patient connected to nasal cannula oxygen  Post-op Assessment: Report given to RN and Post -op Vital signs reviewed and stable  Post vital signs: Reviewed and stable  Last Vitals:  Vitals Value Taken Time  BP 135/94 04/26/2017  3:26 PM  Temp    Pulse 93 04/26/2017  3:28 PM  Resp 10 04/26/2017  3:28 PM  SpO2 100 % 04/26/2017  3:28 PM  Vitals shown include unvalidated device data.  Last Pain:  Vitals:   04/26/17 1239  TempSrc: Oral      Patients Stated Pain Goal: 4 (04/26/17 1239)  Complications: No apparent anesthesia complications

## 2017-04-26 NOTE — Anesthesia Postprocedure Evaluation (Signed)
Anesthesia Post Note  Patient: Alexis Robbins  Procedure(s) Performed: LAPAROSCOPIC TUBAL LIGATION (Bilateral Abdomen)     Patient location during evaluation: PACU Anesthesia Type: General Level of consciousness: sedated and patient cooperative Pain management: pain level controlled Vital Signs Assessment: post-procedure vital signs reviewed and stable Respiratory status: spontaneous breathing Cardiovascular status: stable Anesthetic complications: no    Last Vitals:  Vitals:   04/26/17 1610 04/26/17 1640  BP: 125/80 122/85  Pulse: 74 73  Resp: 18 16  Temp:  36.6 C  SpO2: 100% 100%    Last Pain:  Vitals:   04/26/17 1640  TempSrc:   PainSc: 0-No pain   Pain Goal: Patients Stated Pain Goal: 4 (04/26/17 1600)               Lewie LoronJohn Anessia [place]

## 2017-04-27 ENCOUNTER — Encounter (HOSPITAL_COMMUNITY): Payer: Self-pay | Admitting: Obstetrics and Gynecology

## 2021-12-31 DIAGNOSIS — Z419 Encounter for procedure for purposes other than remedying health state, unspecified: Secondary | ICD-10-CM | POA: Diagnosis not present

## 2022-01-31 DIAGNOSIS — Z419 Encounter for procedure for purposes other than remedying health state, unspecified: Secondary | ICD-10-CM | POA: Diagnosis not present

## 2022-03-03 DIAGNOSIS — Z419 Encounter for procedure for purposes other than remedying health state, unspecified: Secondary | ICD-10-CM | POA: Diagnosis not present

## 2022-04-01 DIAGNOSIS — Z419 Encounter for procedure for purposes other than remedying health state, unspecified: Secondary | ICD-10-CM | POA: Diagnosis not present

## 2022-04-06 ENCOUNTER — Telehealth: Payer: Self-pay

## 2022-04-06 NOTE — Telephone Encounter (Signed)
Mychart msg sent

## 2022-05-02 DIAGNOSIS — Z419 Encounter for procedure for purposes other than remedying health state, unspecified: Secondary | ICD-10-CM | POA: Diagnosis not present

## 2022-06-01 DIAGNOSIS — Z419 Encounter for procedure for purposes other than remedying health state, unspecified: Secondary | ICD-10-CM | POA: Diagnosis not present

## 2022-07-02 DIAGNOSIS — Z419 Encounter for procedure for purposes other than remedying health state, unspecified: Secondary | ICD-10-CM | POA: Diagnosis not present

## 2022-07-26 DIAGNOSIS — Z5329 Procedure and treatment not carried out because of patient's decision for other reasons: Secondary | ICD-10-CM | POA: Diagnosis not present

## 2022-07-26 DIAGNOSIS — N898 Other specified noninflammatory disorders of vagina: Secondary | ICD-10-CM | POA: Diagnosis not present

## 2022-07-26 DIAGNOSIS — R300 Dysuria: Secondary | ICD-10-CM | POA: Diagnosis not present

## 2022-07-26 DIAGNOSIS — M545 Low back pain, unspecified: Secondary | ICD-10-CM | POA: Diagnosis not present

## 2022-07-26 DIAGNOSIS — Z975 Presence of (intrauterine) contraceptive device: Secondary | ICD-10-CM | POA: Diagnosis not present

## 2022-07-26 DIAGNOSIS — R102 Pelvic and perineal pain: Secondary | ICD-10-CM | POA: Diagnosis not present

## 2022-08-01 DIAGNOSIS — Z419 Encounter for procedure for purposes other than remedying health state, unspecified: Secondary | ICD-10-CM | POA: Diagnosis not present

## 2022-09-01 DIAGNOSIS — Z419 Encounter for procedure for purposes other than remedying health state, unspecified: Secondary | ICD-10-CM | POA: Diagnosis not present

## 2022-10-02 DIAGNOSIS — Z419 Encounter for procedure for purposes other than remedying health state, unspecified: Secondary | ICD-10-CM | POA: Diagnosis not present

## 2022-11-01 DIAGNOSIS — Z419 Encounter for procedure for purposes other than remedying health state, unspecified: Secondary | ICD-10-CM | POA: Diagnosis not present

## 2022-12-02 DIAGNOSIS — Z419 Encounter for procedure for purposes other than remedying health state, unspecified: Secondary | ICD-10-CM | POA: Diagnosis not present

## 2023-01-01 DIAGNOSIS — Z419 Encounter for procedure for purposes other than remedying health state, unspecified: Secondary | ICD-10-CM | POA: Diagnosis not present

## 2023-02-01 DIAGNOSIS — Z419 Encounter for procedure for purposes other than remedying health state, unspecified: Secondary | ICD-10-CM | POA: Diagnosis not present

## 2023-03-04 DIAGNOSIS — Z419 Encounter for procedure for purposes other than remedying health state, unspecified: Secondary | ICD-10-CM | POA: Diagnosis not present

## 2023-04-01 DIAGNOSIS — Z419 Encounter for procedure for purposes other than remedying health state, unspecified: Secondary | ICD-10-CM | POA: Diagnosis not present

## 2023-05-12 ENCOUNTER — Inpatient Hospital Stay (HOSPITAL_COMMUNITY)
Admission: AD | Admit: 2023-05-12 | Discharge: 2023-05-12 | Disposition: A | Discharge status: Home / Self Care | Attending: Family Medicine | Admitting: Family Medicine

## 2023-05-12 ENCOUNTER — Encounter (HOSPITAL_COMMUNITY): Payer: Self-pay | Admitting: Family Medicine

## 2023-05-12 DIAGNOSIS — M549 Dorsalgia, unspecified: Secondary | ICD-10-CM | POA: Diagnosis present

## 2023-05-12 DIAGNOSIS — Z9851 Tubal ligation status: Secondary | ICD-10-CM | POA: Diagnosis not present

## 2023-05-12 DIAGNOSIS — Z3202 Encounter for pregnancy test, result negative: Secondary | ICD-10-CM | POA: Diagnosis not present

## 2023-05-12 LAB — COMPREHENSIVE METABOLIC PANEL WITH GFR
ALT: 17 U/L (ref 0–44)
AST: 31 U/L (ref 15–41)
Albumin: 4 g/dL (ref 3.5–5.0)
Alkaline Phosphatase: 57 U/L (ref 38–126)
Anion gap: 11 (ref 5–15)
BUN: 20 mg/dL (ref 6–20)
CO2: 22 mmol/L (ref 22–32)
Calcium: 9 mg/dL (ref 8.9–10.3)
Chloride: 105 mmol/L (ref 98–111)
Creatinine, Ser: 0.77 mg/dL (ref 0.44–1.00)
GFR, Estimated: >60 mL/min (ref >60)
Glucose, Bld: 92 mg/dL (ref 70–99)
Potassium: 4.3 mmol/L (ref 3.5–5.1)
Sodium: 138 mmol/L (ref 135–145)
Total Bilirubin: 0.5 mg/dL (ref 0.0–1.2)
Total Protein: 7.1 g/dL (ref 6.5–8.1)

## 2023-05-12 LAB — CBC
HCT: 36.6 % (ref 36.0–46.0)
Hemoglobin: 12.1 g/dL (ref 12.0–15.0)
MCH: 30.1 pg (ref 26.0–34.0)
MCHC: 33.1 g/dL (ref 30.0–36.0)
MCV: 91 fL (ref 80.0–100.0)
Platelets: 297 10*3/uL (ref 150–400)
RBC: 4.02 MIL/uL (ref 3.87–5.11)
RDW: 13.4 % (ref 11.5–15.5)
WBC: 8 10*3/uL (ref 4.0–10.5)
nRBC: 0 % (ref 0.0–0.2)

## 2023-05-12 LAB — HCG, QUANTITATIVE, PREGNANCY: hCG, Beta Chain, Quant, S: <1 m[IU]/mL (ref <5)

## 2023-05-12 LAB — POCT PREGNANCY, URINE: Preg Test, Ur: NEGATIVE

## 2023-05-12 NOTE — MAU Note (Signed)
 Pt says she has backache x2 weeks . Had BTL- 2019. Had positive UPT tonight.

## 2023-05-12 NOTE — MAU Provider Note (Signed)
 Event Date/Time   First Provider Initiated Contact with Patient 05/12/23 2240      S Ms. Alexis Robbins is a 38 y.o. [phone] patient who presents to MAU today with complaint of back pain and positive UPT in setting of BTL x 6 years. Patient reports back pain for past 2 weeks and unexplained weight gain.  Patient reports she purchased and took a pregnancy test today and it was positive.  She reports she contacted her office and was instructed to come in to r/o ectopic pregnancy.   Patient questions what type of tubal procedure she had clarifying that she wants to know if her tubes were cut, tied, or removed.   No bleeding or issues with urination.   O BP (!) 144/96 (BP Location: Right Arm)   Pulse 84   Temp 98.4 F (36.9 C) (Oral)   Resp 12   Ht 5\' 7"  (1.702 m)   Wt 82.7 kg   LMP 04/27/2023 (Exact Date)   BMI 28.57 kg/m   Results for orders placed or performed during the hospital encounter of 05/12/23 (from the past 24 hours)  Pregnancy, urine POC     Status: None   Collection Time: 05/12/23 10:44 PM  Result Value Ref Range   Preg Test, Ur NEGATIVE NEGATIVE  hCG, quantitative, pregnancy     Status: None   Collection Time: 05/12/23 10:48 PM  Result Value Ref Range   hCG, Beta Chain, Quant, S <1 <5 mIU/mL  Comprehensive metabolic panel     Status: None   Collection Time: 05/12/23 10:55 PM  Result Value Ref Range   Sodium 138 135 - 145 mmol/L   Potassium 4.3 3.5 - 5.1 mmol/L   Chloride 105 98 - 111 mmol/L   CO2 22 22 - 32 mmol/L   Glucose, Bld 92 70 - 99 mg/dL   BUN 20 6 - 20 mg/dL   Creatinine, Ser 2.84 0.44 - 1.00 mg/dL   Calcium 9.0 8.9 - 13.2 mg/dL   Total Protein 7.1 6.5 - 8.1 g/dL   Albumin 4.0 3.5 - 5.0 g/dL   AST 31 15 - 41 U/L   ALT 17 0 - 44 U/L   Alkaline Phosphatase 57 38 - 126 U/L   Total Bilirubin 0.5 0.0 - 1.2 mg/dL   GFR, Estimated >44 >01 mL/min   Anion gap 11 5 - 15  CBC     Status: None   Collection Time: 05/12/23 10:55 PM  Result Value Ref Range    WBC 8.0 4.0 - 10.5 K/uL   RBC 4.02 3.87 - 5.11 MIL/uL   Hemoglobin 12.1 12.0 - 15.0 g/dL   HCT 02.7 25.3 - 66.4 %   MCV 91.0 80.0 - 100.0 fL   MCH 30.1 26.0 - 34.0 pg   MCHC 33.1 30.0 - 36.0 g/dL   RDW 40.3 47.4 - 25.9 %   Platelets 297 150 - 400 K/uL   nRBC 0.0 0.0 - 0.2 %    Physical Exam Vitals and nursing note reviewed. Exam conducted with a chaperone present Abran Abrahams, RN).  HENT:     Head: Atraumatic.  Eyes:     Conjunctiva/sclera: Conjunctivae normal.  Cardiovascular:     Rate and Rhythm: Normal rate.  Pulmonary:     Effort: Pulmonary effort is normal. No respiratory distress.  Musculoskeletal:     Cervical back: Normal range of motion.  Skin:    General: Skin is warm and dry.  Neurological:     Mental Status: She  is alert and oriented to person, place, and time.  Psychiatric:        Behavior: Behavior normal.     A Negative UPT H/O BTL Positive UPT at home  P -Extensive discussion regarding risks and statistics of pregnancy after BTL.  -Reviewed surgery and "type" of tubal; -Informed that portion of tube was removed and ends cauterized.  -Discussed collecting labs including hCG, CMP, and CBC. -Reviewed potential causes for false positive home UPT including expiration, extended wait time, and early detection test.  -Patient given option to wait for results or return, if needed, for further testing and treatment. -Informed she would be called for abnormal results and normal results will be reviewed via mychart. -Patient agreeable. -Ectopic precautions reviewed.   -Attending contacted via secure message regarding patient status.   Loetta Ringer, CNM 05/12/2023 10:41 PM    Addendum (1:58 AM) -Results as above. -Mychart message sent.   Kraig Peru MSN, CNM Advanced Practice Provider, [facility]

## 2023-05-13 DIAGNOSIS — Z419 Encounter for procedure for purposes other than remedying health state, unspecified: Secondary | ICD-10-CM | POA: Diagnosis not present

## 2023-06-12 DIAGNOSIS — Z419 Encounter for procedure for purposes other than remedying health state, unspecified: Secondary | ICD-10-CM | POA: Diagnosis not present

## 2023-07-13 DIAGNOSIS — Z419 Encounter for procedure for purposes other than remedying health state, unspecified: Secondary | ICD-10-CM | POA: Diagnosis not present

## 2023-08-12 DIAGNOSIS — Z419 Encounter for procedure for purposes other than remedying health state, unspecified: Secondary | ICD-10-CM | POA: Diagnosis not present

## 2023-09-12 DIAGNOSIS — Z419 Encounter for procedure for purposes other than remedying health state, unspecified: Secondary | ICD-10-CM | POA: Diagnosis not present

## 2023-10-13 DIAGNOSIS — Z419 Encounter for procedure for purposes other than remedying health state, unspecified: Secondary | ICD-10-CM | POA: Diagnosis not present

## 2023-11-12 DIAGNOSIS — Z419 Encounter for procedure for purposes other than remedying health state, unspecified: Secondary | ICD-10-CM | POA: Diagnosis not present

## 2024-03-21 ENCOUNTER — Ambulatory Visit: Admitting: Family Medicine
